# Patient Record
Sex: Female | Born: 1960 | Race: White | Hispanic: No
Health system: Southern US, Community
[De-identification: ages and names within clinical notes are randomized; demographics above are authoritative.]

## PROBLEM LIST (undated history)

## (undated) DIAGNOSIS — J449 Chronic obstructive pulmonary disease, unspecified: Secondary | ICD-10-CM

---

## 2002-03-13 ENCOUNTER — Other Ambulatory Visit: Admission: RE | Admit: 2002-03-13 | Discharge: 2002-03-13 | Payer: Self-pay | Admitting: Unknown Physician Specialty

## 2013-02-26 ENCOUNTER — Telehealth: Payer: Self-pay | Admitting: Nurse Practitioner

## 2013-02-26 NOTE — Telephone Encounter (Signed)
Samples up front. Pt notified 

## 2013-02-26 NOTE — Telephone Encounter (Signed)
Ok to give samples if we have .

## 2013-02-26 NOTE — Telephone Encounter (Signed)
Please advise 

## 2013-05-03 ENCOUNTER — Other Ambulatory Visit: Payer: Self-pay

## 2013-05-03 MED ORDER — CITALOPRAM HYDROBROMIDE 40 MG PO TABS: 40.0000 mg | ORAL_TABLET | Freq: Every day | ORAL | Status: AC

## 2013-05-03 NOTE — Telephone Encounter (Signed)
Last seen 01/2012   DFS

## 2013-08-30 ENCOUNTER — Telehealth: Payer: Self-pay | Admitting: Nurse Practitioner

## 2013-08-30 NOTE — Telephone Encounter (Signed)
Samples up front 

## 2013-08-30 NOTE — Telephone Encounter (Signed)
Not on med list please review

## 2013-08-30 NOTE — Telephone Encounter (Signed)
Ok for samples?

## 2013-11-20 ENCOUNTER — Telehealth: Payer: Self-pay | Admitting: Nurse Practitioner

## 2013-11-21 NOTE — Telephone Encounter (Signed)
Pt not seen in epic and not on med list?

## 2013-11-21 NOTE — Telephone Encounter (Signed)
No samples available. Needs to make appt to be seeen. Last ov 5/13

## 2013-11-21 NOTE — Telephone Encounter (Signed)
Ok for samples if we have 

## 2014-10-31 ENCOUNTER — Telehealth: Payer: Self-pay | Admitting: Family Medicine

## 2014-10-31 NOTE — Telephone Encounter (Signed)
Patient aware we have no documentation of a pneumonia vaccine

## 2015-10-07 ENCOUNTER — Encounter (HOSPITAL_COMMUNITY): Payer: Self-pay

## 2015-10-07 ENCOUNTER — Emergency Department (HOSPITAL_COMMUNITY): Payer: Medicaid Other

## 2015-10-07 ENCOUNTER — Inpatient Hospital Stay (HOSPITAL_COMMUNITY)
Admission: EM | Admit: 2015-10-07 | Discharge: 2015-10-25 | DRG: 922 | Disposition: E | Payer: Medicaid Other | Attending: Emergency Medicine | Admitting: Emergency Medicine

## 2015-10-07 DIAGNOSIS — J449 Chronic obstructive pulmonary disease, unspecified: Secondary | ICD-10-CM | POA: Diagnosis present

## 2015-10-07 DIAGNOSIS — E872 Acidosis, unspecified: Secondary | ICD-10-CM | POA: Insufficient documentation

## 2015-10-07 DIAGNOSIS — R402432 Glasgow coma scale score 3-8, at arrival to emergency department: Secondary | ICD-10-CM | POA: Diagnosis present

## 2015-10-07 DIAGNOSIS — Z515 Encounter for palliative care: Secondary | ICD-10-CM | POA: Diagnosis present

## 2015-10-07 DIAGNOSIS — R739 Hyperglycemia, unspecified: Secondary | ICD-10-CM | POA: Diagnosis present

## 2015-10-07 DIAGNOSIS — S179XXA Crushing injury of neck, part unspecified, initial encounter: Secondary | ICD-10-CM | POA: Diagnosis present

## 2015-10-07 DIAGNOSIS — Z7951 Long term (current) use of inhaled steroids: Secondary | ICD-10-CM | POA: Diagnosis not present

## 2015-10-07 DIAGNOSIS — Z79899 Other long term (current) drug therapy: Secondary | ICD-10-CM

## 2015-10-07 DIAGNOSIS — T71162S Asphyxiation due to hanging, intentional self-harm, sequela: Secondary | ICD-10-CM | POA: Diagnosis not present

## 2015-10-07 DIAGNOSIS — R509 Fever, unspecified: Secondary | ICD-10-CM | POA: Diagnosis present

## 2015-10-07 DIAGNOSIS — G9340 Encephalopathy, unspecified: Secondary | ICD-10-CM | POA: Diagnosis present

## 2015-10-07 DIAGNOSIS — Z4659 Encounter for fitting and adjustment of other gastrointestinal appliance and device: Secondary | ICD-10-CM

## 2015-10-07 DIAGNOSIS — N179 Acute kidney failure, unspecified: Secondary | ICD-10-CM | POA: Diagnosis present

## 2015-10-07 DIAGNOSIS — I959 Hypotension, unspecified: Secondary | ICD-10-CM | POA: Diagnosis present

## 2015-10-07 DIAGNOSIS — J9601 Acute respiratory failure with hypoxia: Secondary | ICD-10-CM | POA: Diagnosis present

## 2015-10-07 DIAGNOSIS — R680 Hypothermia, not associated with low environmental temperature: Secondary | ICD-10-CM | POA: Diagnosis present

## 2015-10-07 DIAGNOSIS — T71162A Asphyxiation due to hanging, intentional self-harm, initial encounter: Secondary | ICD-10-CM | POA: Diagnosis present

## 2015-10-07 DIAGNOSIS — J384 Edema of larynx: Secondary | ICD-10-CM | POA: Diagnosis present

## 2015-10-07 DIAGNOSIS — F322 Major depressive disorder, single episode, severe without psychotic features: Secondary | ICD-10-CM | POA: Diagnosis present

## 2015-10-07 DIAGNOSIS — N170 Acute kidney failure with tubular necrosis: Secondary | ICD-10-CM | POA: Diagnosis present

## 2015-10-07 DIAGNOSIS — G931 Anoxic brain damage, not elsewhere classified: Secondary | ICD-10-CM | POA: Diagnosis present

## 2015-10-07 DIAGNOSIS — T71161A Asphyxiation due to hanging, accidental, initial encounter: Secondary | ICD-10-CM | POA: Diagnosis present

## 2015-10-07 DIAGNOSIS — G936 Cerebral edema: Secondary | ICD-10-CM | POA: Diagnosis present

## 2015-10-07 DIAGNOSIS — R0901 Asphyxia: Secondary | ICD-10-CM | POA: Diagnosis present

## 2015-10-07 DIAGNOSIS — I469 Cardiac arrest, cause unspecified: Secondary | ICD-10-CM | POA: Diagnosis present

## 2015-10-07 DIAGNOSIS — R000 Tachycardia, unspecified: Secondary | ICD-10-CM | POA: Diagnosis present

## 2015-10-07 DIAGNOSIS — Z452 Encounter for adjustment and management of vascular access device: Secondary | ICD-10-CM

## 2015-10-07 DIAGNOSIS — T71164A Asphyxiation due to hanging, undetermined, initial encounter: Secondary | ICD-10-CM

## 2015-10-07 HISTORY — DX: Chronic obstructive pulmonary disease, unspecified: J44.9

## 2015-10-07 LAB — BLOOD GAS, ARTERIAL
Acid-Base Excess: 12.3 mmol/L — ABNORMAL HIGH (ref 0.0–2.0)
Bicarbonate: 13.9 mEq/L — ABNORMAL LOW (ref 20.0–24.0)
DRAWN BY: 317771
FIO2: 1
MECHVT: 500 mL
O2 SAT: 99.2 %
PEEP/CPAP: 5 cmH2O
PH ART: 7.114 — AB (ref 7.350–7.450)
PO2 ART: 516 mmHg — AB (ref 80.0–100.0)
Patient temperature: 34
RATE: 15 resp/min
pCO2 arterial: 50.5 mmHg — ABNORMAL HIGH (ref 35.0–45.0)

## 2015-10-07 LAB — COMPREHENSIVE METABOLIC PANEL
ALT: <5 U/L — ABNORMAL LOW (ref 14–54)
AST: 68 U/L — AB (ref 15–41)
Albumin: 3.3 g/dL — ABNORMAL LOW (ref 3.5–5.0)
Alkaline Phosphatase: 80 U/L (ref 38–126)
Anion gap: 18 — ABNORMAL HIGH (ref 5–15)
BUN: 9 mg/dL (ref 6–20)
CHLORIDE: 100 mmol/L — AB (ref 101–111)
CO2: 18 mmol/L — ABNORMAL LOW (ref 22–32)
Calcium: 8.8 mg/dL — ABNORMAL LOW (ref 8.9–10.3)
Creatinine, Ser: 1.28 mg/dL — ABNORMAL HIGH (ref 0.44–1.00)
GFR, EST AFRICAN AMERICAN: 55 mL/min — AB (ref >60)
GFR, EST NON AFRICAN AMERICAN: 47 mL/min — AB (ref >60)
Glucose, Bld: 368 mg/dL — ABNORMAL HIGH (ref 65–99)
POTASSIUM: 4 mmol/L (ref 3.5–5.1)
Sodium: 136 mmol/L (ref 135–145)
Total Bilirubin: 0.4 mg/dL (ref 0.3–1.2)
Total Protein: 6.1 g/dL — ABNORMAL LOW (ref 6.5–8.1)

## 2015-10-07 LAB — CBC WITH DIFFERENTIAL/PLATELET
BASOS PCT: 1 %
Basophils Absolute: 0 10*3/uL (ref 0.0–0.1)
EOS ABS: 0.1 10*3/uL (ref 0.0–0.7)
EOS PCT: 1 %
HCT: 42.8 % (ref 36.0–46.0)
Hemoglobin: 13.4 g/dL (ref 12.0–15.0)
LYMPHS ABS: 3.5 10*3/uL (ref 0.7–4.0)
Lymphocytes Relative: 48 %
MCH: 30.1 pg (ref 26.0–34.0)
MCHC: 31.3 g/dL (ref 30.0–36.0)
MCV: 96.2 fL (ref 78.0–100.0)
Monocytes Absolute: 0.3 10*3/uL (ref 0.1–1.0)
Monocytes Relative: 5 %
Neutro Abs: 3.2 10*3/uL (ref 1.7–7.7)
Neutrophils Relative %: 45 %
PLATELETS: 184 10*3/uL (ref 150–400)
RBC: 4.45 MIL/uL (ref 3.87–5.11)
RDW: 13.4 % (ref 11.5–15.5)
WBC: 7.2 10*3/uL (ref 4.0–10.5)

## 2015-10-07 LAB — RAPID URINE DRUG SCREEN, HOSP PERFORMED
Amphetamines: NOT DETECTED
BARBITURATES: NOT DETECTED
Benzodiazepines: NOT DETECTED
Cocaine: NOT DETECTED
Opiates: NOT DETECTED
Tetrahydrocannabinol: NOT DETECTED

## 2015-10-07 LAB — URINE MICROSCOPIC-ADD ON

## 2015-10-07 LAB — I-STAT BETA HCG BLOOD, ED (MC, WL, AP ONLY)

## 2015-10-07 LAB — URINALYSIS, ROUTINE W REFLEX MICROSCOPIC
Bilirubin Urine: NEGATIVE
Glucose, UA: 500 mg/dL — AB
Ketones, ur: NEGATIVE mg/dL
LEUKOCYTES UA: NEGATIVE
Nitrite: POSITIVE — AB
PROTEIN: 100 mg/dL — AB
Specific Gravity, Urine: 1.02 (ref 1.005–1.030)
pH: 7 (ref 5.0–8.0)

## 2015-10-07 LAB — ETHANOL

## 2015-10-07 LAB — PREGNANCY, URINE: PREG TEST UR: NEGATIVE

## 2015-10-07 LAB — TROPONIN I

## 2015-10-07 LAB — I-STAT CG4 LACTIC ACID, ED: LACTIC ACID, VENOUS: 11.89 mmol/L — AB (ref 0.5–2.0)

## 2015-10-07 MED ORDER — NOREPINEPHRINE BITARTRATE 1 MG/ML IV SOLN
1.0000 ug/min | INTRAVENOUS | Status: DC
  Filled 2015-10-07: qty 4

## 2015-10-07 MED ORDER — SODIUM CHLORIDE 0.9 % IV SOLN
Freq: Once | INTRAVENOUS | Status: AC
  Administered 2015-10-07: 20:00:00 via INTRAVENOUS

## 2015-10-07 NOTE — ED Notes (Signed)
Family in room with patient

## 2015-10-07 NOTE — H&P (Signed)
PULMONARY / CRITICAL CARE MEDICINE   Name: Cassandra Donovan MRN: [id number] DOB: April 09, 1961    ADMISSION DATE:  10/19/2015 CONSULTATION DATE:  10/16/2015  REFERRING MD:  EDP at [facility]  CHIEF COMPLAINT:  Suicide attempt via hanging followed by probable respiratory leading to cardiac arrest.  HISTORY OF PRESENT ILLNESS:  Pt is encephelopathic; therefore, this HPI is obtained from chart review. Cassandra Donovan is a 54 y.o. female with PMH of COPD.  She was taken to [facility] 12/14 after she hung herself from a tree.  She apparently called EMS and stated she was suicidal and was going to hang herself.  EMS arrived at the scene 19 minutes later and found pt hanging from a tree with nylon rope.  She was reportedly asystole on the scene.  She received 4 rounds of epi and 1 round of atropine before ROSC, roughly 7 - 10 minutes.  There were multiple attempts at securing airway in the field which were unsuccessful (likely due to laryngeal edema).  In ED, pt was completely unresponsive and had non-reactive pupils.  CT of the head and c-spine were obtained and revealed probable diffuse cerebral edema / anoxia.  There were no acute cervical spine abnormalities.  PAST MEDICAL HISTORY :  She  has a past medical history of COPD (chronic obstructive pulmonary disease) (HCC).  PAST SURGICAL HISTORY: She  has no past surgical history on file.  Allergies  Allergen Reactions  . Silver Other (See Comments)    Unknown reaction    No current facility-administered medications on file prior to encounter.   Current Outpatient Prescriptions on File Prior to Encounter  Medication Sig  . citalopram (CELEXA) 40 MG tablet Take 1 tablet (40 mg total) by mouth daily.    FAMILY HISTORY:  Her has no family status information on file.   SOCIAL HISTORY: She    REVIEW OF SYSTEMS:  Unable to obtain as pt is encephalopathic.  SUBJECTIVE: On full vent support.  VITAL SIGNS: BP 138/104 mmHg  Pulse 125  Temp(Src)  93 F (33.9 C)  Resp 16  SpO2 100%  LMP  (LMP Unknown)  HEMODYNAMICS:    VENTILATOR SETTINGS: Vent Mode:  [-] PRVC FiO2 (%):  [100 %] 100 % Set Rate:  [15 bmp] 15 bmp Vt Set:  [500 mL] 500 mL PEEP:  [5 cmH20] 5 cmH20 Plateau Pressure:  [18 cmH20] 18 cmH20  INTAKE / OUTPUT:     PHYSICAL EXAMINATION: General: Adult female, in NAD. Neuro: Sedated.  Does not follow commands.  GCS 3. HEENT: C-collar in place.  Mild ecchymosis to anterior neck. Pupils non-reactive. No corneals, no gag. Cardiovascular: Tachy, regular, no M/R/G.  Lungs: Respirations even and unlabored.  Coarse bilaterally. Abdomen: BS x 4, soft, NT/ND.  Musculoskeletal: No gross deformities, no edema.  Skin: Intact, warm, no rashes.   LABS:  BMET  Recent Labs Lab 10/02/2015 2000  NA 136  K 4.0  CL 100*  CO2 18*  BUN 9  CREATININE 1.28*  GLUCOSE 368*    Electrolytes  Recent Labs Lab 10/15/2015 2000  CALCIUM 8.8*    CBC  Recent Labs Lab 10/11/2015 2000  WBC 7.2  HGB 13.4  HCT 42.8  PLT 184    Coag's No results for input(s): APTT, INR in the last 168 hours.  Sepsis Markers  Recent Labs Lab 10/24/2015 2030  LATICACIDVEN 11.89*    ABG  Recent Labs Lab 10/06/2015 2049  PHART 7.114*  PCO2ART 50.5*  PO2ART 516*  Liver Enzymes  Recent Labs Lab 2015-08-17 2000  AST 68*  ALT <5*  ALKPHOS 80  BILITOT 0.4  ALBUMIN 3.3*    Cardiac Enzymes  Recent Labs Lab 2015-08-17 2000  TROPONINI <0.03    Glucose No results for input(s): GLUCAP in the last 168 hours.  Imaging Ct Head Wo Contrast  09/05/15  CLINICAL DATA:  Found hanging from tree with a rope around her neck, GCS = 3 EXAM: CT HEAD WITHOUT CONTRAST CT CERVICAL SPINE WITHOUT CONTRAST TECHNIQUE: Multidetector CT imaging of the head and cervical spine was performed following the standard protocol without intravenous contrast. Multiplanar CT image reconstructions of the cervical spine were also generated. COMPARISON:  None  FINDINGS: CT HEAD FINDINGS Small ventricular system. No significant midline shift. Diminished gray-white differentiation diffusely throughout the cerebral hemispheres with low-attenuation at the basal ganglia, likely representing diffuse edema/anoxia. No intracranial hemorrhage, mass lesion, extra-axial fluid collection or focal acute infarct. Sinuses and mastoid air cells clear. No acute osseous findings. CT CERVICAL SPINE FINDINGS Endotracheal nasogastric tubes present. No definite prevertebral soft tissue swelling. Osseous mineralization normal. Vertebral body and disc space heights maintained. Visualized skullbase intact. No cervical spine fracture or subluxation. Spiculated density LEFT apex 12 x 10 mm, question scar versus pulmonary nodule/tumor. IMPRESSION: Probable diffuse cerebral edema/anoxia with poor gray-white differentiation and low-attenuation of the basal ganglia. No acute intracranial hemorrhage. No acute cervical spine abnormalities. Spiculated 12 x 10 mm LEFT upper lobe nodular density question scarring though pulmonary neoplasm not excluded. Electronically Signed   By: Ulyses SouthwardMark  Boles M.D.   On: 09/05/15 21:03   Ct Cervical Spine Wo Contrast  09/05/15  CLINICAL DATA:  Found hanging from tree with a rope around her neck, GCS = 3 EXAM: CT HEAD WITHOUT CONTRAST CT CERVICAL SPINE WITHOUT CONTRAST TECHNIQUE: Multidetector CT imaging of the head and cervical spine was performed following the standard protocol without intravenous contrast. Multiplanar CT image reconstructions of the cervical spine were also generated. COMPARISON:  None FINDINGS: CT HEAD FINDINGS Small ventricular system. No significant midline shift. Diminished gray-white differentiation diffusely throughout the cerebral hemispheres with low-attenuation at the basal ganglia, likely representing diffuse edema/anoxia. No intracranial hemorrhage, mass lesion, extra-axial fluid collection or focal acute infarct. Sinuses and mastoid air  cells clear. No acute osseous findings. CT CERVICAL SPINE FINDINGS Endotracheal nasogastric tubes present. No definite prevertebral soft tissue swelling. Osseous mineralization normal. Vertebral body and disc space heights maintained. Visualized skullbase intact. No cervical spine fracture or subluxation. Spiculated density LEFT apex 12 x 10 mm, question scar versus pulmonary nodule/tumor. IMPRESSION: Probable diffuse cerebral edema/anoxia with poor gray-white differentiation and low-attenuation of the basal ganglia. No acute intracranial hemorrhage. No acute cervical spine abnormalities. Spiculated 12 x 10 mm LEFT upper lobe nodular density question scarring though pulmonary neoplasm not excluded. Electronically Signed   By: Ulyses SouthwardMark  Boles M.D.   On: 09/05/15 21:03   Dg Chest Port 1 View  09/05/15  CLINICAL DATA:  Hanging injury. EXAM: PORTABLE CHEST 1 VIEW COMPARISON:  None. FINDINGS: Pacer/ defibrillator artifact projects over the right-sided chest. Endotracheal tube terminates 4.3 cm above carina.Nasogastric tube extends beyond the inferior aspect of the film. Midline trachea. Normal heart size. No pleural effusion or pneumothorax. Pulmonary interstitial prominence could be technique related or represent the sequelae of smoking/chronic bronchitis. No lobar consolidation. IMPRESSION: No acute cardiopulmonary disease. Electronically Signed   By: Jeronimo GreavesKyle  Talbot M.D.   On: 09/05/15 20:15     STUDIES:  CT head / cspine 12/14 >  probable diffuse cerebral edema / anoxia.  No acute intracranial hemorrhage or cervical spine abnormalities.  Spiculated 12x86mm LUL nodular density, question scarring though neoplasm not excluded. CXR 12/14 > no acute process.  CULTURES: None.  ANTIBIOTICS: None.  SIGNIFICANT EVENTS: 12/14 > admitted after suicide attempt via hanging.  LINES/TUBES: ETT 12/14 >  DISCUSSION: Cassandra Donovan is a 54 y.o. F who had suicidal attempt via hanging on 12/14.  She called EMS  prior to hanging herself.  She had roughly 7 - 10 minutes of ACLS after EMS arrived (roughly 19 minutes after she called EMS).  CT of the head revealed probable diffuse cerebral edema / anoxia.  She was transferred to [facility] for further evaluation and management.  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 P:   Sedation:  Fentanyl PRN / Midazolam PRN. RASS goal: 0 to -1. Daily WUA. Neurology consulted, appreciate input. Poor prognosis for any neuro recovery Will need suicide precautions if neuro status improves. If recovers, will need psych consult once extubated.  PULMONARY A: Probable respiratory leading to cardiac arrest due to asphyxiation following suicide attempt via hanging - had difficult airway with concern for laryngeal edema / crush injury to neck. Hx COPD. P:   Full vent support. Repeat ABG and adjust vent accordingly. VAP prevention measures. No SBT unless neuro status improves. CXR in AM. Consider dedicated CT neck to eval for laryngeal, tracheal injury. May be irrelevant at this point given neuro status  CARDIOVASCULAR A:  Cardiac arrest - due to asphyxiation following suicide attempt via hanging. P:  Levophed as needed to maintain goal MAP > 65. Trend troponins / lactate. EKG in AM.  RENAL A:   AKI. AG metabolic acidosis - lactate. Pseudohypocalcemia - corrects to 9.36. P:   NS @ 100. Trend lactate. BMP in AM.  GASTROINTESTINAL A:  GI prophylaxis. Nutrition. P:   SUP: Pantoprazole. NPO.  HEMATOLOGIC A:   VTE Prophylaxis. P:  SCD's. CBC in AM.  INFECTIOUS A:   Fever, likely neuro in origin, no clear focus infxn P:   Monitor clinically. Obtain blood, urine, resp cx's  ENDOCRINE A:   Hyperglycemia.   P:   SSI. Assess Hgb A1c.   Family updated: Daughter at bedside.  Interdisciplinary Family Meeting v Palliative  Care Meeting:  Due by: 12/20.   Rutherford Guys, [place] [facility] Pager: [phone]  or [phone] 10/16/2015, 9:41 PM   Attending Note:  I have examined patient, reviewed labs, studies and notes. I have discussed the case with Ihor Dow, and I agree with the data and plans as amended above. 54 yo s/p resp arrest and ACLS following intentional asphyxiation. Ventilated post-CPR and transferred to [facility] ICU. On my eval completely unresponsive with negative reflexes except for spontaneous respirations. Head Ct consistent with evolving cerebral edema and anoxic injury. Will continue vent support pending re-eval by neuro am 12/15. Likely not recoverable. Will need to discuss withdrawal of care.  Independent critical care time is 40 minutes.   Levy Pupa, MD, PhD 10/15/15, 5:20 AM [facility] [phone] or if no answer [phone]

## 2015-10-07 NOTE — ED Notes (Signed)
CBG 275 

## 2015-10-07 NOTE — ED Notes (Signed)
Central line place on right side of neck per Dr. Hyacinth MeekerMiller

## 2015-10-07 NOTE — ED Provider Notes (Addendum)
CSN: [id number]     Arrival date & time 10/27/15  2003 History   By signing my name below, I, Arlan Organ, attest that this documentation has been prepared under the direction and in the presence of Eber Hong, MD.  Electronically Signed: Arlan Organ, ED Scribe. 2015-10-27. 8:31 PM.   Chief Complaint  Patient presents with  . Unresponsive    LEVEL 5 CAVEAT- PT IS UNRESPONSIVE  The history is provided by the EMS personnel. No language interpreter was used.    HPI Comments: Michon L Milstein is a 54 y.o. female without any pertinent past medical history who presents to the Emergency Department here after a suicide attempt this evening. Police arrived at 6:56 PM and found pt hanging from a tree with a rope around her neck. EMS arrived and started CPR at 7:04. C-spine was maintained. 4 doses of Epinephrine and 1 dose of Atropine administered on the scene. ROSC return reported. However, unable to obtain airway in field due to crush injury of the neck. Oral block placed with BVM.   PCP: No primary care provider on file.    Past Medical History  Diagnosis Date  . COPD (chronic obstructive pulmonary disease) (HCC)    No past surgical history on file. No family history on file. Social History  Substance Use Topics  . Smoking status: None  . Smokeless tobacco: None  . Alcohol Use: None   OB History    No data available     Review of Systems  Unable to perform ROS: Patient unresponsive      Allergies  Silver  Home Medications   Prior to Admission medications   Medication Sig Start Date End Date Taking? Authorizing Provider  Fluticasone-Salmeterol (ADVAIR) 250-50 MCG/DOSE AEPB Inhale 1 puff into the lungs 2 (two) times daily.   Yes Historical Provider, MD  citalopram (CELEXA) 40 MG tablet Take 1 tablet (40 mg total) by mouth daily. 05/03/13   Ileana Ladd, MD   Triage Vitals: BP 132/94 mmHg  Pulse 125  Temp(Src) 94.5 F (34.7 C)  Resp 12  SpO2 100%  LMP  (LMP  Unknown)   Physical Exam  Constitutional: She appears well-developed and well-nourished.  Pt is unresponsive   HENT:  Head: Normocephalic and atraumatic.  Eyes: Right eye exhibits no discharge. Left eye exhibits no discharge. No scleral icterus.  Pupil 5 mm and non reactive  Neck: No JVD present. No thyromegaly present.  Ligature mark around neck  Cardiovascular: Regular rhythm, normal heart sounds and intact distal pulses.  Tachycardia present.  Exam reveals no gallop and no friction rub.   No murmur heard. Pt is tachycardic to 115  Pulmonary/Chest:  Lungs clear to auscultation with assisted ventilation No spontaneous ventilation  Abdominal: Soft. Bowel sounds are normal. She exhibits no distension and no mass. There is no tenderness.  Musculoskeletal: Normal range of motion. She exhibits no edema or tenderness.  Lymphadenopathy:    She has no cervical adenopathy.  Neurological:  GCS 3 unresponsive   Skin: Skin is warm and dry. No rash noted. No erythema.  Psychiatric: She has a normal mood and affect. Her behavior is normal.  Nursing note and vitals reviewed.   ED Course  NG placement Date/Time: 10-27-2015 8:00 PM Performed by: Eber Hong Authorized by: Eber Hong Consent: The procedure was performed in an emergent situation. Verbal consent obtained. Risks and benefits: risks, benefits and alternatives were discussed Consent given by: daughter. Required items: required blood products, implants, devices, and  special equipment available Patient identity confirmed: arm band Time out: Immediately prior to procedure a "time out" was called to verify the correct patient, procedure, equipment, support staff and site/side marked as required. Preparation: Patient was prepped and draped in the usual sterile fashion. Local anesthesia used: no Patient sedated: no Patient tolerance: Patient tolerated the procedure well with no immediate complications  .Central Line Date/Time:  10/01/2015 10:00 PM Performed by: Eber Hong Authorized by: Eber Hong Consent: The procedure was performed in an emergent situation. Risks and benefits: risks, benefits and alternatives were discussed Consent given by: daughter. Site marked: the operative site was marked Imaging studies: imaging studies available Required items: required blood products, implants, devices, and special equipment available Patient identity confirmed: hospital-assigned identification number and arm band Time out: Immediately prior to procedure a "time out" was called to verify the correct patient, procedure, equipment, support staff and site/side marked as required. Indications: vascular access and central pressure monitoring Patient sedated: no Preparation: skin prepped with ChloraPrep Skin prep agent dried: skin prep agent completely dried prior to procedure Sterile barriers: all five maximum sterile barriers used - cap, mask, sterile gown, sterile gloves, and large sterile sheet Hand hygiene: hand hygiene performed prior to central venous catheter insertion Location details: right internal jugular Patient position: Trendelenburg Catheter type: triple lumen Pre-procedure: landmarks identified Ultrasound guidance: yes Sterile ultrasound techniques: sterile gel and sterile probe covers were used Number of attempts: 1 Successful placement: yes Post-procedure: line sutured and dressing applied Assessment: blood return through all ports,  free fluid flow,  placement verified by x-ray and no pneumothorax on x-ray Patient tolerance: Patient tolerated the procedure well with no immediate complications  ARTERIAL LINE Date/Time: 10/10/2015 11:22 PM Performed by: Eber Hong Authorized by: Eber Hong Consent: The procedure was performed in an emergent situation. Required items: required blood products, implants, devices, and special equipment available Patient identity confirmed: hospital-assigned  identification number Time out: Immediately prior to procedure a "time out" was called to verify the correct patient, procedure, equipment, support staff and site/side marked as required. Preparation: Patient was prepped and draped in the usual sterile fashion. Indications: multiple ABGs and hemodynamic monitoring Location: right radial Patient sedated: no Allen's test normal: yes Seldinger technique: Seldinger technique used Number of attempts: 1 Post-procedure: dressing applied Post-procedure CMS: normal Patient tolerance: Patient tolerated the procedure well with no immediate complications   (including critical care time)  DIAGNOSTIC STUDIES: Oxygen Saturation is 100% on RA, Normal by my interpretation.    COORDINATION OF CARE: 8:25 PM-Discussed treatment plan with pt at bedside and pt agreed to plan.     8:38 PM- Spoke with critical care and recommended levophed for hypotension.   INTUBATION Performed by: Vida Roller   Required items: required blood products, implants, devices, and special equipment available Patient identity confirmed: provided demographic data and hospital-assigned identification number Time out: Immediately prior to procedure a "time out" was called to verify the correct patient, procedure, equipment, support staff and site/side marked as required.  Indications: Apnea, asphyxiation  Intubation method: Direct Laryngoscopy   Preoxygenation: BVM  Sedatives: No Etomidate Paralytic: No Succinylcholine  Tube Size: 7.0  cuffed  Post-procedure assessment: chest rise and ETCO2 monitor Breath sounds: equal and absent over the epigastrium Tube secured with: ETT holder Chest x-ray interpreted by radiologist and me.  Chest x-ray findings: endotracheal tube in appropriate position  Patient tolerated the procedure well with no immediate complications.   Labs Review Labs Reviewed  COMPREHENSIVE METABOLIC PANEL - Abnormal;  Notable for the following:     Chloride 100 (*)    CO2 18 (*)    Glucose, Bld 368 (*)    Creatinine, Ser 1.28 (*)    Calcium 8.8 (*)    Total Protein 6.1 (*)    Albumin 3.3 (*)    AST 68 (*)    ALT <5 (*)    GFR calc non Af Amer 47 (*)    GFR calc Af Amer 55 (*)    Anion gap 18 (*)    All other components within normal limits  URINALYSIS, ROUTINE W REFLEX MICROSCOPIC (NOT AT [facility]) - Abnormal; Notable for the following:    Glucose, UA 500 (*)    Hgb urine dipstick LARGE (*)    Protein, ur 100 (*)    Nitrite POSITIVE (*)    All other components within normal limits  BLOOD GAS, ARTERIAL - Abnormal; Notable for the following:    pH, Arterial 7.114 (*)    pCO2 arterial 50.5 (*)    pO2, Arterial 516 (*)    Bicarbonate 13.9 (*)    Acid-Base Excess 12.3 (*)    All other components within normal limits  URINE MICROSCOPIC-ADD ON - Abnormal; Notable for the following:    Squamous Epithelial / LPF 0-5 (*)    Bacteria, UA MANY (*)    All other components within normal limits  I-STAT CG4 LACTIC ACID, ED - Abnormal; Notable for the following:    Lactic Acid, Venous 11.89 (*)    All other components within normal limits  CBC WITH DIFFERENTIAL/PLATELET  ETHANOL  PREGNANCY, URINE  URINE RAPID DRUG SCREEN, HOSP PERFORMED  TROPONIN I  I-STAT BETA HCG BLOOD, ED ([facility], [facility], [facility] ONLY)  I-STAT CHEM 8, ED  I-STAT CG4 LACTIC ACID, ED    Imaging Review Ct Head Wo Contrast  08/03/15  CLINICAL DATA:  Found hanging from tree with a rope around her neck, GCS = 3 EXAM: CT HEAD WITHOUT CONTRAST CT CERVICAL SPINE WITHOUT CONTRAST TECHNIQUE: Multidetector CT imaging of the head and cervical spine was performed following the standard protocol without intravenous contrast. Multiplanar CT image reconstructions of the cervical spine were also generated. COMPARISON:  None FINDINGS: CT HEAD FINDINGS Small ventricular system. No significant midline shift. Diminished gray-white differentiation diffusely throughout the cerebral hemispheres with  low-attenuation at the basal ganglia, likely representing diffuse edema/anoxia. No intracranial hemorrhage, mass lesion, extra-axial fluid collection or focal acute infarct. Sinuses and mastoid air cells clear. No acute osseous findings. CT CERVICAL SPINE FINDINGS Endotracheal nasogastric tubes present. No definite prevertebral soft tissue swelling. Osseous mineralization normal. Vertebral body and disc space heights maintained. Visualized skullbase intact. No cervical spine fracture or subluxation. Spiculated density LEFT apex 12 x 10 mm, question scar versus pulmonary nodule/tumor. IMPRESSION: Probable diffuse cerebral edema/anoxia with poor gray-white differentiation and low-attenuation of the basal ganglia. No acute intracranial hemorrhage. No acute cervical spine abnormalities. Spiculated 12 x 10 mm LEFT upper lobe nodular density question scarring though pulmonary neoplasm not excluded. Electronically Signed   By: Ulyses SouthwardMark  Boles M.D.   On: 08/03/15 21:03   Ct Cervical Spine Wo Contrast  08/03/15  CLINICAL DATA:  Found hanging from tree with a rope around her neck, GCS = 3 EXAM: CT HEAD WITHOUT CONTRAST CT CERVICAL SPINE WITHOUT CONTRAST TECHNIQUE: Multidetector CT imaging of the head and cervical spine was performed following the standard protocol without intravenous contrast. Multiplanar CT image reconstructions of the cervical spine were also generated. COMPARISON:  None FINDINGS:  CT HEAD FINDINGS Small ventricular system. No significant midline shift. Diminished gray-white differentiation diffusely throughout the cerebral hemispheres with low-attenuation at the basal ganglia, likely representing diffuse edema/anoxia. No intracranial hemorrhage, mass lesion, extra-axial fluid collection or focal acute infarct. Sinuses and mastoid air cells clear. No acute osseous findings. CT CERVICAL SPINE FINDINGS Endotracheal nasogastric tubes present. No definite prevertebral soft tissue swelling. Osseous  mineralization normal. Vertebral body and disc space heights maintained. Visualized skullbase intact. No cervical spine fracture or subluxation. Spiculated density LEFT apex 12 x 10 mm, question scar versus pulmonary nodule/tumor. IMPRESSION: Probable diffuse cerebral edema/anoxia with poor gray-white differentiation and low-attenuation of the basal ganglia. No acute intracranial hemorrhage. No acute cervical spine abnormalities. Spiculated 12 x 10 mm LEFT upper lobe nodular density question scarring though pulmonary neoplasm not excluded. Electronically Signed   By: Ulyses Southward M.D.   On: 10/20/2015 21:03   Dg Chest Port 1 View  10/12/2015  CLINICAL DATA:  Hanging injury. EXAM: PORTABLE CHEST 1 VIEW COMPARISON:  None. FINDINGS: Pacer/ defibrillator artifact projects over the right-sided chest. Endotracheal tube terminates 4.3 cm above carina.Nasogastric tube extends beyond the inferior aspect of the film. Midline trachea. Normal heart size. No pleural effusion or pneumothorax. Pulmonary interstitial prominence could be technique related or represent the sequelae of smoking/chronic bronchitis. No lobar consolidation. IMPRESSION: No acute cardiopulmonary disease. Electronically Signed   By: Jeronimo Greaves M.D.   On: 10/14/2015 20:15   Dg Chest Port 1v Same Day  09/27/2015  CLINICAL DATA:  RIGHT central line placement. Hanging injury earlier today. History of COPD. EXAM: PORTABLE CHEST 1 VIEW COMPARISON:  Chest radiograph October 07, 2015 at 2001 hours. FINDINGS: Interval placement of RIGHT internal jugular central venous catheter with distal tip projecting in mid superior vena cava. Endotracheal tube tip projects 4.2 cm above the carina. Nasogastric tube looped in proximal stomach, distal tip not imaged. Cardiomediastinal silhouette is normal. Similar bronchitic changes without pleural effusion or focal consolidation. No pneumothorax. Multiple EKG lines overlie the patient and may obscure subtle underlying  pathology. Pacer pad overlies the RIGHT chest. Soft tissue planes and included osseous structures are nonsuspicious. IMPRESSION: RIGHT internal jugular central venous catheter distal tip projects in mid superior vena cava. No pneumothorax. No apparent change in remaining life support lines. Similar bronchitic changes. Electronically Signed   By: Awilda Metro M.D.   On: 09/26/2015 22:27   I have personally reviewed and evaluated these images and lab results as part of my medical decision-making.  ED ECG REPORT  I personally interpreted this EKG   Date: 10/09/2015   Rate: 119  Rhythm: sinus tachycardia  QRS Axis: normal  Intervals: normal  ST/T Wave abnormalities: ST depressions diffusely  Conduction Disutrbances:none  Narrative Interpretation:   Old EKG Reviewed: none available   MDM   Final diagnoses:  Asphyxia  Anoxic brain injury (HCC)  Acidosis  Acute respiratory failure with hypoxia (HCC)  Hypothermia    Anoxic brain injury suspected Hypotensive and hypothermic Tachycardic No response to painful stimuli  Unable to get airway in field, I got X 1 attempt under DL, 7-0 tube I placed NG tube  D/w [facility], they have accepted to [facility] - BP improved without Levo - Ip laced an 18 gauge in the L hand  Family updated  Holding orders written for ICU at [facility]  Angiocath insertion Performed by: Eber Hong D  Consent: Verbal consent obtained. Risks and benefits: risks, benefits and alternatives were discussed Time out: Immediately prior to procedure a "  time out" was called to verify the correct patient, procedure, equipment, support staff and site/side marked as required.  Preparation: Patient was prepped and draped in the usual sterile fashion.  Vein Location: L hand  Not Ultrasound Guided  Gauge: 18  CRITICAL CARE Performed by: Vida Roller Total critical care time: 35 minutes Critical care time was exclusive of separately billable procedures and treating other  patients. Critical care was necessary to treat or prevent imminent or life-threatening deterioration. Critical care was time spent personally by me on the following activities: development of treatment plan with patient and/or surrogate as well as nursing, discussions with consultants, evaluation of patient's response to treatment, examination of patient, obtaining history from patient or surrogate, ordering and performing treatments and interventions, ordering and review of laboratory studies, ordering and review of radiographic studies, pulse oximetry and re-evaluation of patient's condition.   Normal blood return and flush without difficulty Patient tolerance: Patient tolerated the procedure well with no immediate complications.     I personally performed the services described in this documentation, which was scribed in my presence. The recorded information has been reviewed and is accurate.      Eber Hong, MD 10-21-2015 [id number]  Eber Hong, MD 10/21/15 [phone]

## 2015-10-07 NOTE — Code Documentation (Signed)
Pt was brought in by ems after finding pt hanging from a tree by a nylon rope.  Pt transported with multiple attempts to intubate without success due to airway swelling.  Pt arrives with nasal airway in place, bagging via bvm, no response to pain or other stimuli.  Pupils are 4mm non-reactive (slight reaction to right)   Pt continues to be unresponsive.

## 2015-10-08 ENCOUNTER — Inpatient Hospital Stay (HOSPITAL_COMMUNITY): Payer: Medicaid Other

## 2015-10-08 DIAGNOSIS — E872 Acidosis, unspecified: Secondary | ICD-10-CM | POA: Insufficient documentation

## 2015-10-08 DIAGNOSIS — T71162A Asphyxiation due to hanging, intentional self-harm, initial encounter: Principal | ICD-10-CM

## 2015-10-08 DIAGNOSIS — J9601 Acute respiratory failure with hypoxia: Secondary | ICD-10-CM | POA: Diagnosis present

## 2015-10-08 DIAGNOSIS — T71161A Asphyxiation due to hanging, accidental, initial encounter: Secondary | ICD-10-CM | POA: Diagnosis present

## 2015-10-08 DIAGNOSIS — G931 Anoxic brain damage, not elsewhere classified: Secondary | ICD-10-CM | POA: Insufficient documentation

## 2015-10-08 DIAGNOSIS — I469 Cardiac arrest, cause unspecified: Secondary | ICD-10-CM | POA: Diagnosis present

## 2015-10-08 DIAGNOSIS — R0901 Asphyxia: Secondary | ICD-10-CM

## 2015-10-08 LAB — BASIC METABOLIC PANEL
ANION GAP: 11 (ref 5–15)
BUN: 13 mg/dL (ref 6–20)
CHLORIDE: 109 mmol/L (ref 101–111)
CO2: 20 mmol/L — ABNORMAL LOW (ref 22–32)
Calcium: 8.3 mg/dL — ABNORMAL LOW (ref 8.9–10.3)
Creatinine, Ser: 1.61 mg/dL — ABNORMAL HIGH (ref 0.44–1.00)
GFR calc non Af Amer: 36 mL/min — ABNORMAL LOW (ref >60)
GFR, EST AFRICAN AMERICAN: 41 mL/min — AB (ref >60)
GLUCOSE: 105 mg/dL — AB (ref 65–99)
Potassium: 4.4 mmol/L (ref 3.5–5.1)
Sodium: 140 mmol/L (ref 135–145)

## 2015-10-08 LAB — CBG MONITORING, ED: GLUCOSE-CAPILLARY: 275 mg/dL — AB (ref 65–99)

## 2015-10-08 LAB — POCT I-STAT 3, ART BLOOD GAS (G3+)
Acid-base deficit: 6 mmol/L — ABNORMAL HIGH (ref 0.0–2.0)
Bicarbonate: 20.9 mEq/L (ref 20.0–24.0)
O2 SAT: 98 %
PCO2 ART: 51 mmHg — AB (ref 35.0–45.0)
PH ART: 7.233 — AB (ref 7.350–7.450)
PO2 ART: 138 mmHg — AB (ref 80.0–100.0)
Patient temperature: 103.3
TCO2: 22 mmol/L (ref 0–100)

## 2015-10-08 LAB — CBC
HEMATOCRIT: 44.6 % (ref 36.0–46.0)
HEMATOCRIT: 44.6 % (ref 36.0–46.0)
HEMOGLOBIN: 14.5 g/dL (ref 12.0–15.0)
Hemoglobin: 14.5 g/dL (ref 12.0–15.0)
MCH: 30.3 pg (ref 26.0–34.0)
MCH: 30.2 pg (ref 26.0–34.0)
MCHC: 32.5 g/dL (ref 30.0–36.0)
MCHC: 32.5 g/dL (ref 30.0–36.0)
MCV: 93.1 fL (ref 78.0–100.0)
MCV: 92.9 fL (ref 78.0–100.0)
Platelets: 190 10*3/uL (ref 150–400)
Platelets: 161 10*3/uL (ref 150–400)
RBC: 4.79 MIL/uL (ref 3.87–5.11)
RBC: 4.8 MIL/uL (ref 3.87–5.11)
RDW: 13.6 % (ref 11.5–15.5)
RDW: 13.6 % (ref 11.5–15.5)
WBC: 24.8 10*3/uL — AB (ref 4.0–10.5)
WBC: 22.6 10*3/uL — ABNORMAL HIGH (ref 4.0–10.5)

## 2015-10-08 LAB — LACTIC ACID, PLASMA: Lactic Acid, Venous: 1.8 mmol/L (ref 0.5–2.0)

## 2015-10-08 LAB — CREATININE, SERUM
Creatinine, Ser: 1.49 mg/dL — ABNORMAL HIGH (ref 0.44–1.00)
GFR calc non Af Amer: 39 mL/min — ABNORMAL LOW (ref >60)
GFR, EST AFRICAN AMERICAN: 46 mL/min — AB (ref >60)

## 2015-10-08 LAB — GLUCOSE, CAPILLARY
GLUCOSE-CAPILLARY: 85 mg/dL (ref 65–99)
Glucose-Capillary: 75 mg/dL (ref 65–99)

## 2015-10-08 LAB — PHOSPHORUS: Phosphorus: 3.8 mg/dL (ref 2.5–4.6)

## 2015-10-08 LAB — TROPONIN I
Troponin I: 5.38 ng/mL (ref <0.031)
Troponin I: 6.28 ng/mL (ref <0.031)

## 2015-10-08 LAB — TRIGLYCERIDES
Triglycerides: 139 mg/dL (ref <150)
Triglycerides: 91 mg/dL (ref <150)

## 2015-10-08 LAB — MAGNESIUM: Magnesium: 1.6 mg/dL — ABNORMAL LOW (ref 1.7–2.4)

## 2015-10-08 MED ORDER — ANTISEPTIC ORAL RINSE SOLUTION (CORINZ)
7.0000 mL | OROMUCOSAL | Status: DC
  Administered 2015-10-08 (×5): 7 mL via OROMUCOSAL

## 2015-10-08 MED ORDER — NOREPINEPHRINE BITARTRATE 1 MG/ML IV SOLN
2.0000 ug/min | INTRAVENOUS | Status: DC
  Filled 2015-10-08: qty 4

## 2015-10-08 MED ORDER — ACETAMINOPHEN 325 MG PO TABS
650.0000 mg | ORAL_TABLET | ORAL | Status: DC | PRN
  Administered 2015-10-08 (×2): 650 mg
  Filled 2015-10-08 (×2): qty 2

## 2015-10-08 MED ORDER — MIDAZOLAM HCL 5 MG/5ML IJ SOLN: 1.0000 mg | INTRAMUSCULAR | Status: DC | PRN

## 2015-10-08 MED ORDER — PANTOPRAZOLE SODIUM 40 MG IV SOLR
40.0000 mg | Freq: Every day | INTRAVENOUS | Status: DC
Start: 2015-10-08 — End: 2015-10-09

## 2015-10-08 MED ORDER — FENTANYL CITRATE (PF) 100 MCG/2ML IJ SOLN: 100.0000 ug | INTRAMUSCULAR | Status: DC | PRN

## 2015-10-08 MED ORDER — SODIUM CHLORIDE 0.9 % IV SOLN: 250.0000 mL | INTRAVENOUS | Status: DC | PRN

## 2015-10-08 MED ORDER — VITAL HIGH PROTEIN PO LIQD
1000.0000 mL | ORAL | Status: DC
  Filled 2015-10-08 (×2): qty 1000

## 2015-10-08 MED ORDER — SODIUM CHLORIDE 0.9 % IV SOLN
INTRAVENOUS | Status: DC
  Administered 2015-10-08: 03:00:00 via INTRAVENOUS

## 2015-10-08 MED ORDER — MAGNESIUM SULFATE 2 GM/50ML IV SOLN
2.0000 g | Freq: Once | INTRAVENOUS | Status: AC
  Administered 2015-10-08: 2 g via INTRAVENOUS
  Filled 2015-10-08: qty 50

## 2015-10-08 MED ORDER — PROPOFOL 1000 MG/100ML IV EMUL: 0.0000 ug/kg/min | INTRAVENOUS | Status: DC

## 2015-10-08 MED ORDER — CHLORHEXIDINE GLUCONATE 0.12% ORAL RINSE (MEDLINE KIT)
15.0000 mL | Freq: Two times a day (BID) | OROMUCOSAL | Status: DC
  Administered 2015-10-08: 15 mL via OROMUCOSAL

## 2015-10-08 MED ORDER — PANTOPRAZOLE SODIUM 40 MG IV SOLR: 40.0000 mg | Freq: Every day | INTRAVENOUS | Status: DC

## 2015-10-08 MED ORDER — HEPARIN SODIUM (PORCINE) 5000 UNIT/ML IJ SOLN: 5000.0000 [IU] | Freq: Three times a day (TID) | INTRAMUSCULAR | Status: DC

## 2015-10-08 MED ORDER — PROPOFOL 1000 MG/100ML IV EMUL: 5.0000 ug/kg/min | INTRAVENOUS | Status: DC

## 2015-10-08 MED ORDER — FENTANYL BOLUS VIA INFUSION
50.0000 ug | INTRAVENOUS | Status: DC | PRN
  Filled 2015-10-08: qty 50

## 2015-10-08 MED ORDER — PROPOFOL 1000 MG/100ML IV EMUL
INTRAVENOUS | Status: AC
  Filled 2015-10-08: qty 100

## 2015-10-08 MED ORDER — MIDAZOLAM HCL 2 MG/2ML IJ SOLN: 2.0000 mg | INTRAMUSCULAR | Status: DC | PRN

## 2015-10-08 MED ORDER — SODIUM CHLORIDE 0.9 % IV BOLUS (SEPSIS)
1000.0000 mL | Freq: Once | INTRAVENOUS | Status: AC
  Administered 2015-10-08: 1000 mL via INTRAVENOUS

## 2015-10-08 MED ORDER — INSULIN ASPART 100 UNIT/ML ~~LOC~~ SOLN: 0.0000 [IU] | SUBCUTANEOUS | Status: DC

## 2015-10-08 MED ORDER — FENTANYL CITRATE (PF) 100 MCG/2ML IJ SOLN: 50.0000 ug | INTRAMUSCULAR | Status: DC | PRN

## 2015-10-08 MED ORDER — FENTANYL CITRATE (PF) 100 MCG/2ML IJ SOLN
50.0000 ug | Freq: Once | INTRAMUSCULAR | Status: AC
  Administered 2015-10-08: 50 ug via INTRAVENOUS

## 2015-10-08 MED ORDER — ONDANSETRON HCL 4 MG/2ML IJ SOLN: 4.0000 mg | Freq: Three times a day (TID) | INTRAMUSCULAR | Status: DC | PRN

## 2015-10-08 MED ORDER — SODIUM CHLORIDE 0.9 % IV SOLN
25.0000 ug/h | INTRAVENOUS | Status: DC
  Administered 2015-10-08: 50 ug/h via INTRAVENOUS
  Filled 2015-10-08: qty 50

## 2015-10-08 MED ORDER — ACETAMINOPHEN 650 MG RE SUPP
650.0000 mg | Freq: Once | RECTAL | Status: AC
  Administered 2015-10-08: 650 mg via RECTAL
  Filled 2015-10-08: qty 1

## 2015-10-09 LAB — HEMOGLOBIN A1C
HEMOGLOBIN A1C: 6.1 % — AB (ref 4.8–5.6)
MEAN PLASMA GLUCOSE: 128 mg/dL

## 2015-10-09 LAB — GLUCOSE, CAPILLARY: GLUCOSE-CAPILLARY: 105 mg/dL — AB (ref 65–99)

## 2015-10-10 LAB — URINE CULTURE: Culture: >=100000

## 2015-10-11 LAB — CULTURE, RESPIRATORY W GRAM STAIN

## 2015-10-13 LAB — CULTURE, BLOOD (ROUTINE X 2)
CULTURE: NO GROWTH
CULTURE: NO GROWTH

## 2015-10-25 NOTE — Progress Notes (Signed)
eLink Physician-Brief Progress Note Patient Name: Thurston HoleChrystal L Tewell DOB: 1960/11/24 MRN: [id number]   Date of Service  08/16/2015  HPI/Events of Note  Family gathering and have decided to terminate life supports  eICU Interventions  Pt appears comfortable and breathing sltly over back up rate so fm notified she may survive terminal extubation but ok to pull et when they are all agreeable and focus just on comfort measures going forward      Intervention Category Major Interventions: End of life / care limitation discussion  Sandrea HughsMichael Ryelle Ruvalcaba 08/16/2015, 8:15 PM

## 2015-10-25 NOTE — Progress Notes (Signed)
Dr. Sherene SiresWert notified of decreasing blood pressure. Dr. Tyson AliasFeinstein had a discussion with family this morning and they decided to no longer escalate care and focus on comfort. No new orders at this time except to discontinue current order for Levo. Will continue to monitor closely and provide support.

## 2015-10-25 NOTE — Progress Notes (Signed)
RD consulted for Tubefeeding Mgmt, family has elected to no longer escalate care. Tubefeed was never administered.  Dionne AnoWilliam M. Annsleigh Dragoo, MS, RD LDN After Hours/Weekend Pager [phone]

## 2015-10-25 NOTE — Care Management Note (Signed)
Case Management Note  Patient Details  Name: Thurston HoleChrystal L Mckey MRN: [id number] Date of Birth: 1961-07-15  Subjective/Objective:   Pt admitted on 10/12/2015 s/p suicide attempt by hanging.   Pt with anoxic brain injury s/p cardiac arrest.  PTA, pt independent of ADLS.                  Action/Plan: Pt with poor prognosis.  Will follow progress/offer support.    Expected Discharge Date:                  Expected Discharge Plan:     In-House Referral:   Clinical Social Worker  Discharge planning Services   CM Referral  Post Acute Care Choice:    Choice offered to:     DME Arranged:    DME Agency:     HH Arranged:    HH Agency:     Status of Service:   In process, will continue to follow  Medicare Important Message Given:    Date Medicare IM Given:    Medicare IM give by:    Date Additional Medicare IM Given:    Additional Medicare Important Message give by:     If discussed at Long Length of Stay Meetings, dates discussed:    Additional Comments:  Quintella BatonJulie W. Boy Delamater, RN, BSN  Trauma/Neuro ICU Case Manager [phone]

## 2015-10-25 NOTE — Procedures (Signed)
Extubation Procedure Note  Patient Details:   Name: Cassandra Donovan DOB: Mar 12, 1961 MRN: [id number]    Terminal extubated per MD. RN and family at bedside.    Fredrich BirksMarshburn, Jezabel Lecker Lynne 10/14/2015, 9:16 PM

## 2015-10-25 NOTE — Progress Notes (Signed)
Patient ID: Cassandra Donovan, female   DOB: [birth year], 55 y.o.   MRN: [id number]  I have had extensive discussions with family daughtetr. We discussed patients current circumstances and organ failures. We also discussed patient's prior wishes under circumstances such as this. Family has decided to NOT perform resuscitation if arrest but to continue current medical support for now.  Mcarthur Rossettianiel J. Tyson AliasFeinstein, MD, FACP Pgr: [phone] [facility]

## 2015-10-25 NOTE — Progress Notes (Signed)
CDS referral number [id number]. Per Kathlene NovemberMike, CDS, will follow by phone.

## 2015-10-25 NOTE — Progress Notes (Signed)
PULMONARY / CRITICAL CARE MEDICINE   Name: Cassandra Donovan MRN: [id number] DOB: [birth year]    ADMISSION DATE:  10/09/2015 CONSULTATION DATE:  10/04/2015  REFERRING MD:  EDP at [facility]:  Suicide attempt via hanging followed by probable respiratory leading to cardiac arrest.  HISTORY OF PRESENT ILLNESS:  Pt is encephelopathic; therefore, this HPI is obtained from chart review. Cassandra Donovan is a 55 y.o. female with PMH of COPD.  She was taken to [facility] 12/14 after she hung herself from a tree.  She apparently called EMS and stated she was suicidal and was going to hang herself.  EMS arrived at the scene 19 minutes later and found pt hanging from a tree with nylon rope.  She was reportedly asystole on the scene.  She received 4 rounds of epi and 1 round of atropine before ROSC, roughly 7 - 10 minutes.  There were multiple attempts at securing airway in the field which were unsuccessful (likely due to laryngeal edema).  In ED, pt was completely unresponsive and had non-reactive pupils.  CT of the head and c-spine were obtained and revealed probable diffuse cerebral edema / anoxia.  There were no acute cervical spine abnormalities.  SUBJECTIVE: remains vented  VITAL SIGNS: BP 101/64 mmHg  Pulse 94  Temp(Src) 104.2 F (40.1 C)  Resp 24  Ht 5' 2"  (1.575 m)  Wt 66.7 kg (147 lb 0.8 oz)  BMI 26.89 kg/m2  SpO2 97%  LMP  (LMP Unknown)  HEMODYNAMICS:    VENTILATOR SETTINGS: Vent Mode:  [-] PRVC FiO2 (%):  [40 %-100 %] 50 % Set Rate:  [14 bmp-15 bmp] 14 bmp Vt Set:  [500 mL] 500 mL PEEP:  [5 cmH20] 5 cmH20 Plateau Pressure:  [18 cmH20-22 cmH20] 20 cmH20  INTAKE / OUTPUT: I/O last 3 completed shifts: In: 416.6 [I.V.:416.6] Out: 265 [Urine:265]   PHYSICAL EXAMINATION: General: Adult female, in NAD. Neuro: breaths over vent, no corneals, no per, no gag, no cough, Does not follow commands. HEENT: C-collar in place.  Mild ecchymosis to anterior neck. Pupils  non-reactive. No corneals, no gag. Cardiovascular: Tachy, regular, no M/R/G.  Lungs: Respirations even and unlabored.  Coarse bilaterally. Abdomen: BS x 4, soft, NT/ND.  Musculoskeletal: No gross deformities, no edema.  Skin: Intact, warm, no rashes.   LABS:  BMET  Recent Labs Lab 09/26/2015 2000 06-Nov-2015 0407 11/06/15 0750  NA 136  --  140  K 4.0  --  4.4  CL 100*  --  109  CO2 18*  --  20*  BUN 9  --  13  CREATININE 1.28* 1.49* 1.61*  GLUCOSE 368*  --  105*    Electrolytes  Recent Labs Lab 10/09/2015 2000 Nov 06, 2015 0750  CALCIUM 8.8* 8.3*  MG  --  1.6*  PHOS  --  3.8    CBC  Recent Labs Lab 10/15/2015 2000 2015/11/06 0407 11/06/2015 0750  WBC 7.2 24.8* 22.6*  HGB 13.4 14.5 14.5  HCT 42.8 44.6 44.6  PLT 184 190 161    Coag's No results for input(s): APTT, INR in the last 168 hours.  Sepsis Markers  Recent Labs Lab 09/30/2015 2030 2015-11-06 0407  LATICACIDVEN 11.89* 1.8    ABG  Recent Labs Lab 10/05/2015 2049 2015-11-06 0542  PHART 7.114* 7.233*  PCO2ART 50.5* 51.0*  PO2ART 516* 138.0*    Liver Enzymes  Recent Labs Lab 10/09/2015 2000  AST 68*  ALT <5*  ALKPHOS 80  BILITOT 0.4  ALBUMIN 3.3*  Cardiac Enzymes  Recent Labs Lab 10/18/2015 2000 October 21, 2015 0407  TROPONINI <0.03 5.38*    Glucose  Recent Labs Lab 10/06/2015 2003 10-21-2015 0757  GLUCAP 275* 85    Imaging Ct Head Wo Contrast  10/22/2015  CLINICAL DATA:  Found hanging from tree with a rope around her neck, GCS = 3 EXAM: CT HEAD WITHOUT CONTRAST CT CERVICAL SPINE WITHOUT CONTRAST TECHNIQUE: Multidetector CT imaging of the head and cervical spine was performed following the standard protocol without intravenous contrast. Multiplanar CT image reconstructions of the cervical spine were also generated. COMPARISON:  None FINDINGS: CT HEAD FINDINGS Small ventricular system. No significant midline shift. Diminished gray-white differentiation diffusely throughout the cerebral hemispheres  with low-attenuation at the basal ganglia, likely representing diffuse edema/anoxia. No intracranial hemorrhage, mass lesion, extra-axial fluid collection or focal acute infarct. Sinuses and mastoid air cells clear. No acute osseous findings. CT CERVICAL SPINE FINDINGS Endotracheal nasogastric tubes present. No definite prevertebral soft tissue swelling. Osseous mineralization normal. Vertebral body and disc space heights maintained. Visualized skullbase intact. No cervical spine fracture or subluxation. Spiculated density LEFT apex 12 x 10 mm, question scar versus pulmonary nodule/tumor. IMPRESSION: Probable diffuse cerebral edema/anoxia with poor gray-white differentiation and low-attenuation of the basal ganglia. No acute intracranial hemorrhage. No acute cervical spine abnormalities. Spiculated 12 x 10 mm LEFT upper lobe nodular density question scarring though pulmonary neoplasm not excluded. Electronically Signed   By: Lavonia Dana M.D.   On: 10/13/2015 21:03   Ct Cervical Spine Wo Contrast  10/05/2015  CLINICAL DATA:  Found hanging from tree with a rope around her neck, GCS = 3 EXAM: CT HEAD WITHOUT CONTRAST CT CERVICAL SPINE WITHOUT CONTRAST TECHNIQUE: Multidetector CT imaging of the head and cervical spine was performed following the standard protocol without intravenous contrast. Multiplanar CT image reconstructions of the cervical spine were also generated. COMPARISON:  None FINDINGS: CT HEAD FINDINGS Small ventricular system. No significant midline shift. Diminished gray-white differentiation diffusely throughout the cerebral hemispheres with low-attenuation at the basal ganglia, likely representing diffuse edema/anoxia. No intracranial hemorrhage, mass lesion, extra-axial fluid collection or focal acute infarct. Sinuses and mastoid air cells clear. No acute osseous findings. CT CERVICAL SPINE FINDINGS Endotracheal nasogastric tubes present. No definite prevertebral soft tissue swelling. Osseous  mineralization normal. Vertebral body and disc space heights maintained. Visualized skullbase intact. No cervical spine fracture or subluxation. Spiculated density LEFT apex 12 x 10 mm, question scar versus pulmonary nodule/tumor. IMPRESSION: Probable diffuse cerebral edema/anoxia with poor gray-white differentiation and low-attenuation of the basal ganglia. No acute intracranial hemorrhage. No acute cervical spine abnormalities. Spiculated 12 x 10 mm LEFT upper lobe nodular density question scarring though pulmonary neoplasm not excluded. Electronically Signed   By: Lavonia Dana M.D.   On: 09/27/2015 21:03   Dg Chest Port 1 View  10/01/2015  CLINICAL DATA:  Hanging injury. EXAM: PORTABLE CHEST 1 VIEW COMPARISON:  None. FINDINGS: Pacer/ defibrillator artifact projects over the right-sided chest. Endotracheal tube terminates 4.3 cm above carina.Nasogastric tube extends beyond the inferior aspect of the film. Midline trachea. Normal heart size. No pleural effusion or pneumothorax. Pulmonary interstitial prominence could be technique related or represent the sequelae of smoking/chronic bronchitis. No lobar consolidation. IMPRESSION: No acute cardiopulmonary disease. Electronically Signed   By: Abigail Miyamoto M.D.   On: 10/02/2015 20:15   Dg Chest Port 1v Same Day  10/02/2015  CLINICAL DATA:  RIGHT central line placement. Hanging injury earlier today. History of COPD. EXAM: PORTABLE CHEST 1 VIEW COMPARISON:  Chest radiograph October 07, 2015 at 2001 hours. FINDINGS: Interval placement of RIGHT internal jugular central venous catheter with distal tip projecting in mid superior vena cava. Endotracheal tube tip projects 4.2 cm above the carina. Nasogastric tube looped in proximal stomach, distal tip not imaged. Cardiomediastinal silhouette is normal. Similar bronchitic changes without pleural effusion or focal consolidation. No pneumothorax. Multiple EKG lines overlie the patient and may obscure subtle underlying  pathology. Pacer pad overlies the RIGHT chest. Soft tissue planes and included osseous structures are nonsuspicious. IMPRESSION: RIGHT internal jugular central venous catheter distal tip projects in mid superior vena cava. No pneumothorax. No apparent change in remaining life support lines. Similar bronchitic changes. Electronically Signed   By: Elon Alas M.D.   On: 10/07/2015 22:27   Dg Abd Portable 1v  2015/11/06  CLINICAL DATA:  Orogastric tube placement. EXAM: PORTABLE ABDOMEN - 1 VIEW COMPARISON:  Chest x-ray 10/05/2015. FINDINGS: Orogastric tube noted with its tip projected over distal stomach. Soft tissue structures are unremarkable. No bowel or gastric distention. No free air. No acute bony abnormality. IMPRESSION: Orogastric tube noted with its tip projected over the distal stomach. No bowel or gastric distention. Electronically Signed   By: Marcello Moores  Register   On: November 06, 2015 07:19     STUDIES:  CT head / cspine 12/14 > probable diffuse cerebral edema / anoxia.  No acute intracranial hemorrhage or cervical spine abnormalities.  Spiculated 12x75m LUL nodular density, question scarring though neoplasm not excluded. CXR 12/14 > no acute process.  CULTURES: None.  ANTIBIOTICS: None.  SIGNIFICANT EVENTS: 12/14 > admitted after suicide attempt via hanging.  LINES/TUBES: ETT 12/14 >  DISCUSSION: Cassandra Donovan a 55y.o. F who had suicidal attempt via hanging on 12/14.  She called EMS prior to hanging herself.  She had roughly 7 - 10 minutes of ACLS after EMS arrived (roughly 19 minutes after she called EMS).  CT of the head revealed probable diffuse cerebral edema / anoxia.  [facility]  ASSESSMENT / PLAN:  NEUROLOGIC A:   Acute encephalopathy - svere anoxic brain injury, huigh risk brain death P:   Sedation:  Avoiding as able RASS goal: 0  Daily WUA. EEG Will discuss consideration comfort  care  PULMONARY A: Probable respiratory leading to cardiac arrest due to asphyxiation following suicide attempt via hanging - had difficult airway with concern for laryngeal edema / crush injury to neck. Hx COPD. P:   Full vent support. Avoid acidosis Increase MV, abg reviewed Alk will not change outcome  CARDIOVASCULAR A:  Cardiac arrest - due to asphyxiation following suicide attempt via hanging. P:  MAp goal 65, no role higher tele Trop from cpr  RENAL A:   AKI. AG metabolic acidosis - lactate. Pseudohypocalcemia - corrects to 9.36. ATN hypomag P:   NS @ 100, avoid with brain edema Chem in am  Mag supp  GASTROINTESTINAL A:  GI prophylaxis. Nutrition. P:   SUP: Pantoprazole. Start TF  HEMATOLOGIC A:   VTE Prophylaxis. P:  SCD's. CBC in AM. Get coags, start sib q hep  INFECTIOUS A:   Fever, likely neuro in origin, no clear focus infxn P:   Avoid temps Add cooling blanket  ENDOCRINE A:   Hyperglycemia.   P:   SSI.   Family updated: Cassandra Donovan at bedside.  Interdisciplinary Family Meeting v Palliative Care Meeting:  Due by: 12/20.   Ccm time 30 min   DLavon Paganini FTitus Mould  MD, FACP Pgr: [facility]

## 2015-10-25 NOTE — Discharge Summary (Signed)
NAMEDERRICKA, MERTZ NO.:  [id number]  MEDICAL RECORD NO.:  [id number]  LOCATION:  3M06C                        FACILITY:  [place]  PHYSICIAN:  Raylene Miyamoto, MD DATE OF BIRTH:  [birth year]  DATE OF ADMISSION:  10/02/2015 DATE OF DISCHARGE:  18-Oct-2015                              DISCHARGE SUMMARY   DEATH SUMMARY  This is a sad case of a 55 year old female with past medical history of COPD, who was taken to the [facility] on October 07, 2015, after she was found hung herself from the tree.  She apparently called EMS and stated that she was suicidal and was going to hang herself.  EMS arrived on the scene approximately 20 minutes later and found the patient is hanging from the tree with nylon rope.  She is reportedly asystole on the scene.  She received another 7-10 minutes of ACLS and was resuscitated back to sinus rhythm.  CT scan of the head revealed severe brain edema.  The patient had a horrible neurologic examination neurologically.  The next morning, I met with the daughter and she described some mild depressive symptoms but nothing overt.  She understood that the patient was almost near brain death, but I think was not at the time.  She has only had left neurologically with ability to breathe over the ventilator.  Given the CT scan findings, poor neurologic findings on examination, comfort care was decided upon and the patient expired.  FINAL DIAGNOSES UPON DEATH: 1. Severe anoxic brain injury secondary to hanging. 2. Asphyxiation with severe hypoxemia secondary to hanging. 3. Status post cardiac arrest secondary to #2. 4. Severe major depression. 5. Chronic obstructive pulmonary disease. 6. Acute respiratory failure.     Raylene Miyamoto, MD     DJF/MEDQ  D:  10/14/2015  T:  10/15/2015  Job:  [id number]

## 2015-10-25 NOTE — Consult Note (Signed)
Admission H&P    Chief Complaint: Anoxic brain injury.  HPI: Cassandra Donovan is an 55 y.o. female with a history of COPD, brought to the emergency room at [facility] following a suicide attempt by hanging. Patient was found hanging from a tree 19 minutes after she called EMS and indicated that she was suicidal. She was found on cardiac arrest and underwent resuscitation for 7-10 minutes. She was subsequently intubated and has remained unresponsive. CT scan of her head was obtained which showed and is consistent with diffuse anoxic changes as well as early signs of cerebral edema. No intracranial hemorrhage was demonstrated.  Past Medical History  Diagnosis Date  . COPD (chronic obstructive pulmonary disease) (HCC)     No past surgical history on file.  No family history on file. Social History:  has no tobacco, alcohol, and drug history on file.  Allergies:  Allergies  Allergen Reactions  . Silver Other (See Comments)    Unknown reaction    Medications Prior to Admission  Medication Sig Dispense Refill  . Fluticasone-Salmeterol (ADVAIR) 250-50 MCG/DOSE AEPB Inhale 1 puff into the lungs 2 (two) times daily.    . citalopram (CELEXA) 40 MG tablet Take 1 tablet (40 mg total) by mouth daily. 30 tablet 0    ROS: Unavailable.  Physical Examination: Blood pressure 94/83, pulse 100, temperature 103.3 F (39.6 C), resp. rate 22, height 5' 2"  (1.575 m), weight 66.7 kg (147 lb 0.8 oz), SpO2 100 %.  HEENT-  Normocephalic, no lesions, without obvious abnormality.  Normal external eye and conjunctiva.  Normal TM's bilaterally.  Normal auditory canals and external ears. Normal external nose, mucus membranes and septum.  Normal pharynx. Neck supple with no masses, nodes, nodules or enlargement. Cardiovascular - tachycardic with regular rhythm and normal S1 and S2  Lungs - tachypneic with spontaneous respirations, coarse rhonchi noted laterally  Abdomen - soft, non-tender; bowel sounds normal; no  masses,  no organomegaly Extremities - no joint deformities, effusion, or inflammation and no edema  Neurologic Examination: Patient was intubated and on mechanical ventilation and had spontaneous respirations, as well. She was on fentanyl drip IV for sedation. Pupils were equal and did not react to light on either side. Extraocular movements are absent with oculocephalic maneuvers. Corneal reflexes were absent. Face was symmetrical with no focal weakness. Gag reflex was absent. Motor exam showed flaccid muscle tone throughout with no abnormal posturing and no spontaneous movements of extremities. Deep tendon reflexes were trace only and symmetrical. Plantar responses were mute.  Results for orders placed or performed during the hospital encounter of 10/13/2015 (from the past 48 hour(s))  CBC with Differential/Platelet     Status: None   Collection Time: 10/11/2015  8:00 PM  Result Value Ref Range   WBC 7.2 4.0 - 10.5 K/uL    Comment: REPEATED TO VERIFY   RBC 4.45 3.87 - 5.11 MIL/uL   Hemoglobin 13.4 12.0 - 15.0 g/dL   HCT 42.8 36.0 - 46.0 %   MCV 96.2 78.0 - 100.0 fL   MCH 30.1 26.0 - 34.0 pg   MCHC 31.3 30.0 - 36.0 g/dL   RDW 13.4 11.5 - 15.5 %   Platelets 184 150 - 400 K/uL   Neutrophils Relative % 45 %   Neutro Abs 3.2 1.7 - 7.7 K/uL   Lymphocytes Relative 48 %   Lymphs Abs 3.5 0.7 - 4.0 K/uL   Monocytes Relative 5 %   Monocytes Absolute 0.3 0.1 - 1.0 K/uL  Eosinophils Relative 1 %   Eosinophils Absolute 0.1 0.0 - 0.7 K/uL   Basophils Relative 1 %   Basophils Absolute 0.0 0.0 - 0.1 K/uL   WBC Morphology ATYPICAL LYMPHOCYTES     Comment: RARE NRBCs  Comprehensive metabolic panel     Status: Abnormal   Collection Time: 09/27/2015  8:00 PM  Result Value Ref Range   Sodium 136 135 - 145 mmol/L   Potassium 4.0 3.5 - 5.1 mmol/L   Chloride 100 (L) 101 - 111 mmol/L   CO2 18 (L) 22 - 32 mmol/L   Glucose, Bld 368 (H) 65 - 99 mg/dL   BUN 9 6 - 20 mg/dL   Creatinine, Ser 1.28 (H)  0.44 - 1.00 mg/dL   Calcium 8.8 (L) 8.9 - 10.3 mg/dL   Total Protein 6.1 (L) 6.5 - 8.1 g/dL   Albumin 3.3 (L) 3.5 - 5.0 g/dL   AST 68 (H) 15 - 41 U/L   ALT <5 (L) 14 - 54 U/L   Alkaline Phosphatase 80 38 - 126 U/L   Total Bilirubin 0.4 0.3 - 1.2 mg/dL   GFR calc non Af Amer 47 (L) >60 mL/min   GFR calc Af Amer 55 (L) >60 mL/min    Comment: (NOTE) The eGFR has been calculated using the CKD EPI equation. This calculation has not been validated in all clinical situations. eGFR's persistently <60 mL/min signify possible Chronic Kidney Disease.    Anion gap 18 (H) 5 - 15  Ethanol     Status: None   Collection Time: 10/22/2015  8:00 PM  Result Value Ref Range   Alcohol, Ethyl (B) <5 <5 mg/dL    Comment:        LOWEST DETECTABLE LIMIT FOR SERUM ALCOHOL IS 5 mg/dL FOR MEDICAL PURPOSES ONLY   Troponin I     Status: None   Collection Time: 09/29/2015  8:00 PM  Result Value Ref Range   Troponin I <0.03 <0.031 ng/mL    Comment:        NO INDICATION OF MYOCARDIAL INJURY.   Triglycerides     Status: None   Collection Time: 10/19/2015  8:00 PM  Result Value Ref Range   Triglycerides 139 <150 mg/dL  I-Stat Beta hCG blood, ED ([facility], [facility], [facility] only)     Status: None   Collection Time: 10/15/2015  8:26 PM  Result Value Ref Range   I-stat hCG, quantitative <5.0 <5 mIU/mL   Comment 3            Comment:   GEST. AGE      CONC.  (mIU/mL)   <=1 WEEK        5 - 50     2 WEEKS       50 - 500     3 WEEKS       100 - 10,000     4 WEEKS     1,000 - 30,000        FEMALE AND NON-PREGNANT FEMALE:     LESS THAN 5 mIU/mL   I-Stat CG4 Lactic Acid, ED     Status: Abnormal   Collection Time: 10/09/2015  8:30 PM  Result Value Ref Range   Lactic Acid, Venous 11.89 (HH) 0.5 - 2.0 mmol/L  Blood gas, arterial     Status: Abnormal   Collection Time: 10/03/2015  8:49 PM  Result Value Ref Range   FIO2 1.00    Delivery systems NASAL CANNULA    Mode PRESSURE  REGULATED VOLUME CONTROL    VT 500 mL   LHR 15.0 resp/min    Peep/cpap 5.0 cm H20   pH, Arterial 7.114 (LL) 7.350 - 7.450    Comment: CRITICAL RESULT CALLED TO, READ BACK BY AND VERIFIED WITH: JANETTE JOHNSON,RN BY BFRETWELL RRT,RCP ON 10/02/2015 AT 2103    pCO2 arterial 50.5 (H) 35.0 - 45.0 mmHg   pO2, Arterial 516 (H) 80.0 - 100.0 mmHg   Bicarbonate 13.9 (L) 20.0 - 24.0 mEq/L   Acid-Base Excess 12.3 (H) 0.0 - 2.0 mmol/L   O2 Saturation 99.2 %   Patient temperature 34.0    Collection site RIGHT RADIAL    Drawn by [id number]    Sample type ARTERIAL DRAW    Allens test (pass/fail) PASS PASS  Urinalysis, Routine w reflex microscopic (not at [facility])     Status: Abnormal   Collection Time: 10/22/2015  9:26 PM  Result Value Ref Range   Color, Urine YELLOW YELLOW   APPearance CLEAR CLEAR   Specific Gravity, Urine 1.020 1.005 - 1.030   pH 7.0 5.0 - 8.0   Glucose, UA 500 (A) NEGATIVE mg/dL   Hgb urine dipstick LARGE (A) NEGATIVE   Bilirubin Urine NEGATIVE NEGATIVE   Ketones, ur NEGATIVE NEGATIVE mg/dL   Protein, ur 100 (A) NEGATIVE mg/dL   Nitrite POSITIVE (A) NEGATIVE   Leukocytes, UA NEGATIVE NEGATIVE  Pregnancy, urine     Status: None   Collection Time: 10/04/2015  9:26 PM  Result Value Ref Range   Preg Test, Ur NEGATIVE NEGATIVE    Comment:        THE SENSITIVITY OF THIS METHODOLOGY IS >20 mIU/mL.   Urine rapid drug screen (hosp performed)     Status: None   Collection Time: 10/24/2015  9:26 PM  Result Value Ref Range   Opiates NONE DETECTED NONE DETECTED   Cocaine NONE DETECTED NONE DETECTED   Benzodiazepines NONE DETECTED NONE DETECTED   Amphetamines NONE DETECTED NONE DETECTED   Tetrahydrocannabinol NONE DETECTED NONE DETECTED   Barbiturates NONE DETECTED NONE DETECTED    Comment:        DRUG SCREEN FOR MEDICAL PURPOSES ONLY.  IF CONFIRMATION IS NEEDED FOR ANY PURPOSE, NOTIFY LAB WITHIN 5 DAYS.        LOWEST DETECTABLE LIMITS FOR URINE DRUG SCREEN Drug Class       Cutoff (ng/mL) Amphetamine      1000 Barbiturate       200 Benzodiazepine   244 Tricyclics       010 Opiates          300 Cocaine          300 THC              50   Urine microscopic-add on     Status: Abnormal   Collection Time: 10/01/2015  9:26 PM  Result Value Ref Range   Squamous Epithelial / LPF 0-5 (A) NONE SEEN   WBC, UA 6-30 0 - 5 WBC/hpf   RBC / HPF TOO NUMEROUS TO COUNT 0 - 5 RBC/hpf   Bacteria, UA MANY (A) NONE SEEN   Ct Head Wo Contrast  10/01/2015  CLINICAL DATA:  Found hanging from tree with a rope around her neck, GCS = 3 EXAM: CT HEAD WITHOUT CONTRAST CT CERVICAL SPINE WITHOUT CONTRAST TECHNIQUE: Multidetector CT imaging of the head and cervical spine was performed following the standard protocol without intravenous contrast. Multiplanar CT image reconstructions of the cervical spine were also generated.  COMPARISON:  None FINDINGS: CT HEAD FINDINGS Small ventricular system. No significant midline shift. Diminished gray-white differentiation diffusely throughout the cerebral hemispheres with low-attenuation at the basal ganglia, likely representing diffuse edema/anoxia. No intracranial hemorrhage, mass lesion, extra-axial fluid collection or focal acute infarct. Sinuses and mastoid air cells clear. No acute osseous findings. CT CERVICAL SPINE FINDINGS Endotracheal nasogastric tubes present. No definite prevertebral soft tissue swelling. Osseous mineralization normal. Vertebral body and disc space heights maintained. Visualized skullbase intact. No cervical spine fracture or subluxation. Spiculated density LEFT apex 12 x 10 mm, question scar versus pulmonary nodule/tumor. IMPRESSION: Probable diffuse cerebral edema/anoxia with poor gray-white differentiation and low-attenuation of the basal ganglia. No acute intracranial hemorrhage. No acute cervical spine abnormalities. Spiculated 12 x 10 mm LEFT upper lobe nodular density question scarring though pulmonary neoplasm not excluded. Electronically Signed   By: Lavonia Dana M.D.   On: 10/06/2015  21:03   Ct Cervical Spine Wo Contrast  09/29/2015  CLINICAL DATA:  Found hanging from tree with a rope around her neck, GCS = 3 EXAM: CT HEAD WITHOUT CONTRAST CT CERVICAL SPINE WITHOUT CONTRAST TECHNIQUE: Multidetector CT imaging of the head and cervical spine was performed following the standard protocol without intravenous contrast. Multiplanar CT image reconstructions of the cervical spine were also generated. COMPARISON:  None FINDINGS: CT HEAD FINDINGS Small ventricular system. No significant midline shift. Diminished gray-white differentiation diffusely throughout the cerebral hemispheres with low-attenuation at the basal ganglia, likely representing diffuse edema/anoxia. No intracranial hemorrhage, mass lesion, extra-axial fluid collection or focal acute infarct. Sinuses and mastoid air cells clear. No acute osseous findings. CT CERVICAL SPINE FINDINGS Endotracheal nasogastric tubes present. No definite prevertebral soft tissue swelling. Osseous mineralization normal. Vertebral body and disc space heights maintained. Visualized skullbase intact. No cervical spine fracture or subluxation. Spiculated density LEFT apex 12 x 10 mm, question scar versus pulmonary nodule/tumor. IMPRESSION: Probable diffuse cerebral edema/anoxia with poor gray-white differentiation and low-attenuation of the basal ganglia. No acute intracranial hemorrhage. No acute cervical spine abnormalities. Spiculated 12 x 10 mm LEFT upper lobe nodular density question scarring though pulmonary neoplasm not excluded. Electronically Signed   By: Lavonia Dana M.D.   On: 10/11/2015 21:03   Dg Chest Port 1 View  09/25/2015  CLINICAL DATA:  Hanging injury. EXAM: PORTABLE CHEST 1 VIEW COMPARISON:  None. FINDINGS: Pacer/ defibrillator artifact projects over the right-sided chest. Endotracheal tube terminates 4.3 cm above carina.Nasogastric tube extends beyond the inferior aspect of the film. Midline trachea. Normal heart size. No pleural  effusion or pneumothorax. Pulmonary interstitial prominence could be technique related or represent the sequelae of smoking/chronic bronchitis. No lobar consolidation. IMPRESSION: No acute cardiopulmonary disease. Electronically Signed   By: Abigail Miyamoto M.D.   On: 09/30/2015 20:15   Dg Chest Port 1v Same Day  09/24/2015  CLINICAL DATA:  RIGHT central line placement. Hanging injury earlier today. History of COPD. EXAM: PORTABLE CHEST 1 VIEW COMPARISON:  Chest radiograph October 07, 2015 at 2001 hours. FINDINGS: Interval placement of RIGHT internal jugular central venous catheter with distal tip projecting in mid superior vena cava. Endotracheal tube tip projects 4.2 cm above the carina. Nasogastric tube looped in proximal stomach, distal tip not imaged. Cardiomediastinal silhouette is normal. Similar bronchitic changes without pleural effusion or focal consolidation. No pneumothorax. Multiple EKG lines overlie the patient and may obscure subtle underlying pathology. Pacer pad overlies the RIGHT chest. Soft tissue planes and included osseous structures are nonsuspicious. IMPRESSION: RIGHT internal jugular central venous catheter distal  tip projects in mid superior vena cava. No pneumothorax. No apparent change in remaining life support lines. Similar bronchitic changes. Electronically Signed   By: Elon Alas M.D.   On: 10/01/2015 22:27    Assessment/Plan 55 year old lady with acute diffuse severe anoxic brain injury secondary to asphyxia and cardiac arrest. Prognosis is poor for functional recovery neurologically, if patient survives this acute injury.  Recommendations: 1. EEG, routine adult study assess severity of encephalopathy electrically, as well as rule out subclinical seizure activity. 2. No indications for continuous EEG monitoring at this point   We will continue to follow this patient with you.   C.R. Nicole Kindred, MD [facility] [phone]  26-Oct-2015, 4:30 AM

## 2015-10-25 NOTE — Progress Notes (Signed)
Time of death 2128. Two nurses listened. Emotional support given to family. Dr. Sherene SiresWert notified.

## 2015-10-25 DEATH — deceased

## 2017-05-31 IMAGING — CT CT CERVICAL SPINE W/O CM
3 of 5 series · 10 of 33 positions shown, 12 images · non-contrast
Comparison: None

CLINICAL DATA: Found hanging from tree with a rope around her neck,
GCS = 3

EXAM:
CT HEAD WITHOUT CONTRAST
CT CERVICAL SPINE WITHOUT CONTRAST
TECHNIQUE: Multidetector CT imaging of the head and cervical spine was
performed following the standard protocol without intravenous
contrast. Multiplanar CT image reconstructions of the cervical spine
were also generated.

[Series 5: cervical st 2.0 b31s · axial · 0.27mm/px · z∈[+79,+143]mm · 2 of 82 slices shown, 3 images]
[im 33/82  soft-tissue]
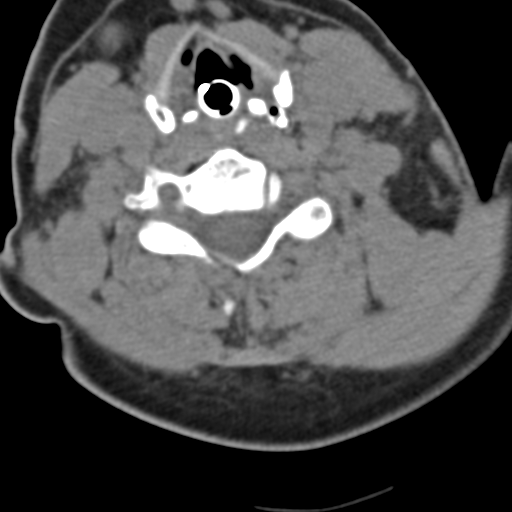
[im 33/82  bone]
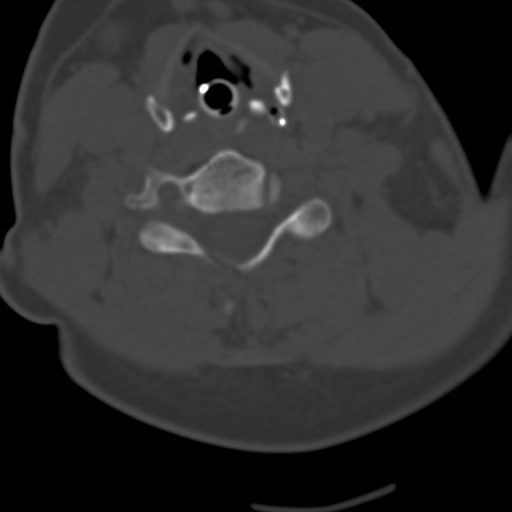
[im 65/82  bone]
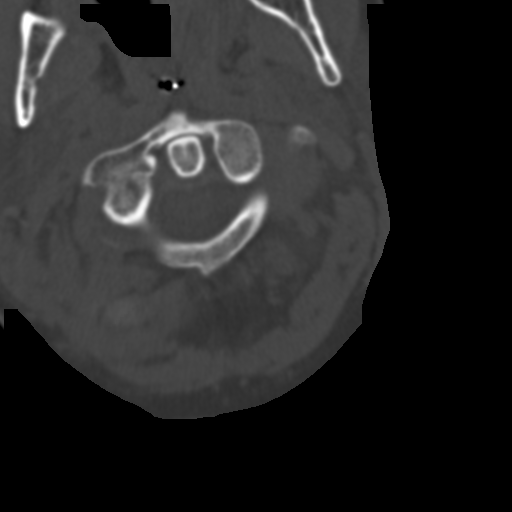

[Series 7: sagittal bone 2.0 · sagittal · 0.19mm/px · 5 of 47 slices shown, 6 images]
[im 16/47  bone]
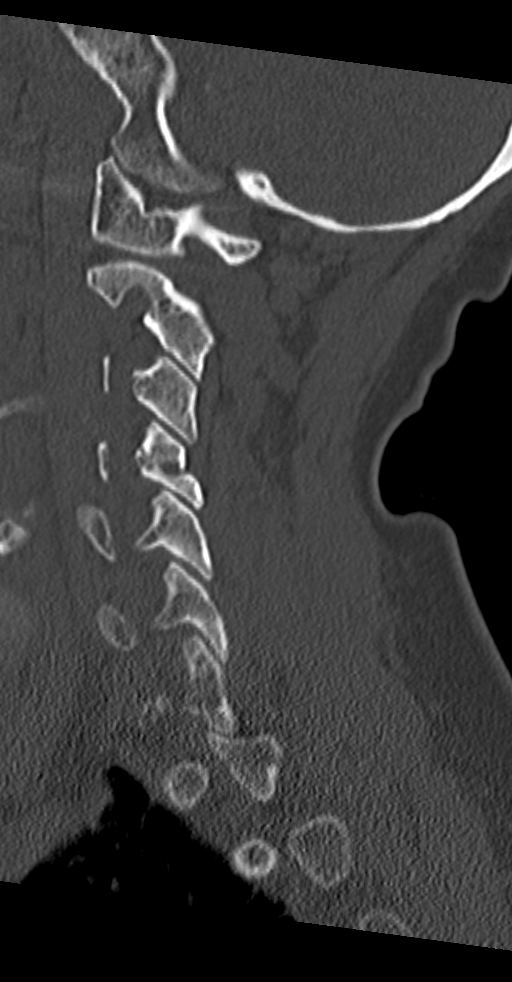
[im 20/47  bone]
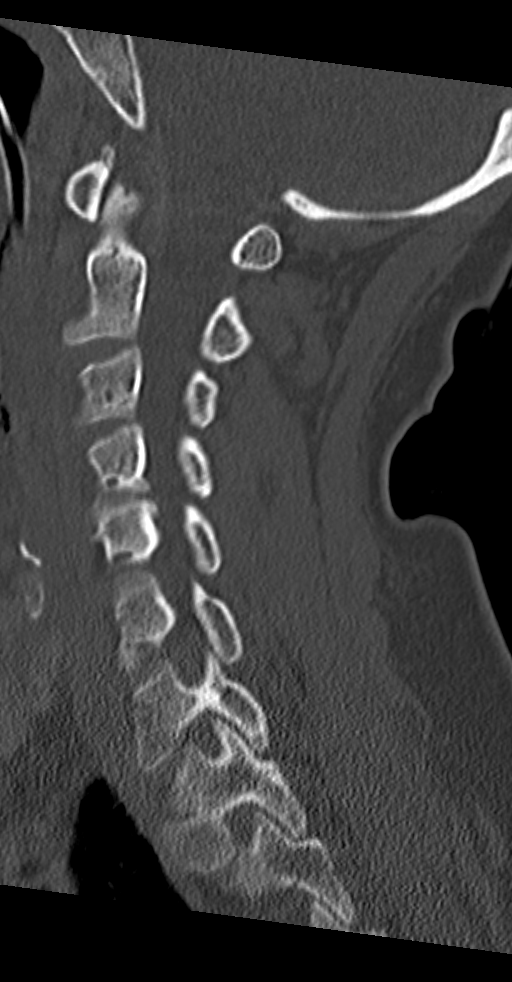
[im 24/47  soft-tissue]
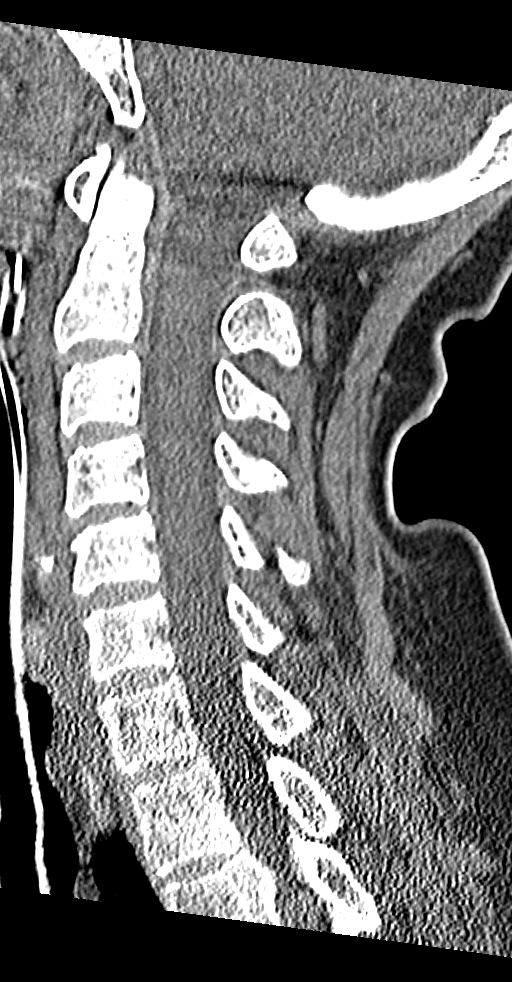
[im 24/47  bone]
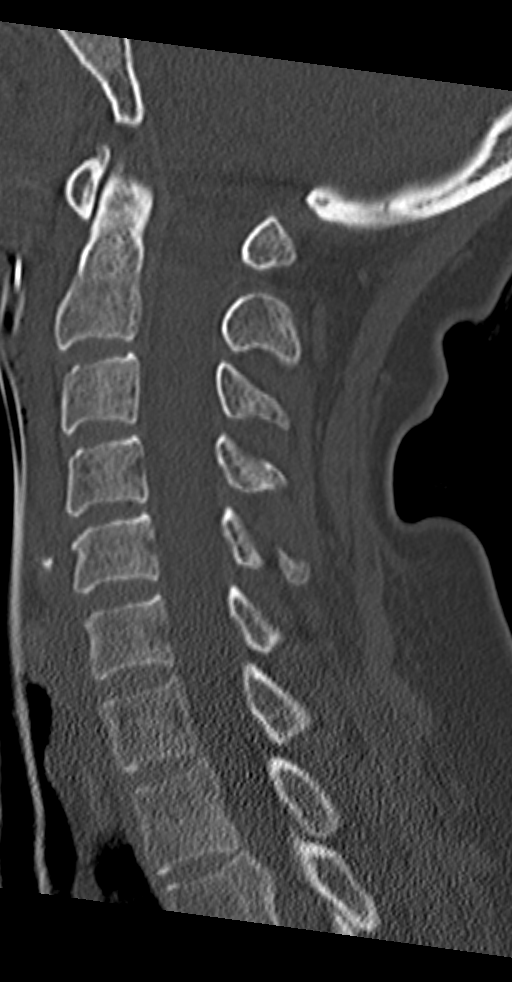
[im 27/47  bone]
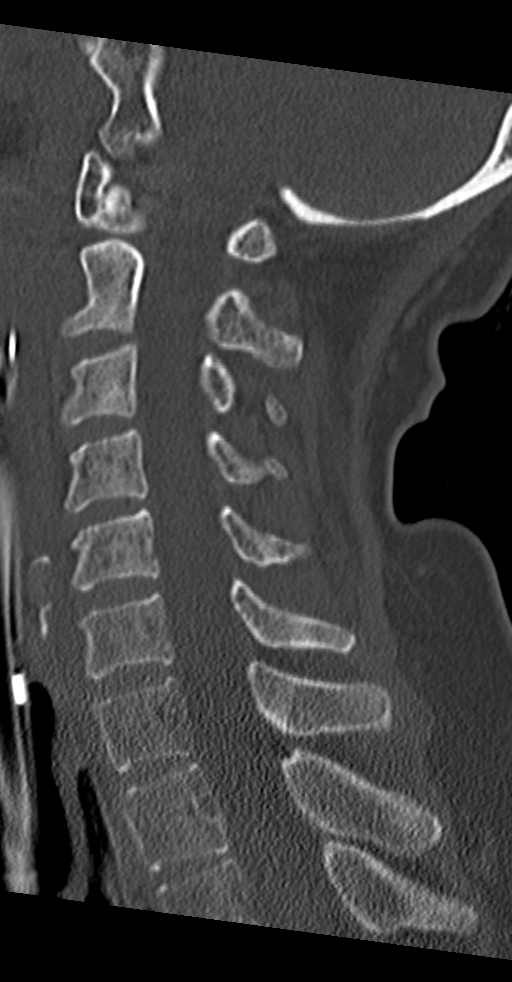
[im 31/47  bone]
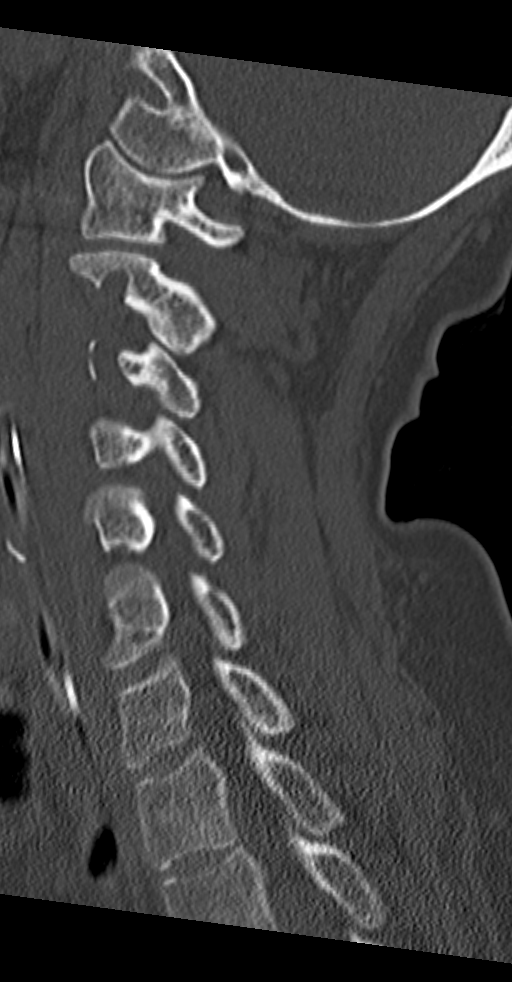

[Series 8: coronal bone 2.0 · coronal · 0.20mm/px · 3 of 49 slices shown]
[im 10/49  bone]
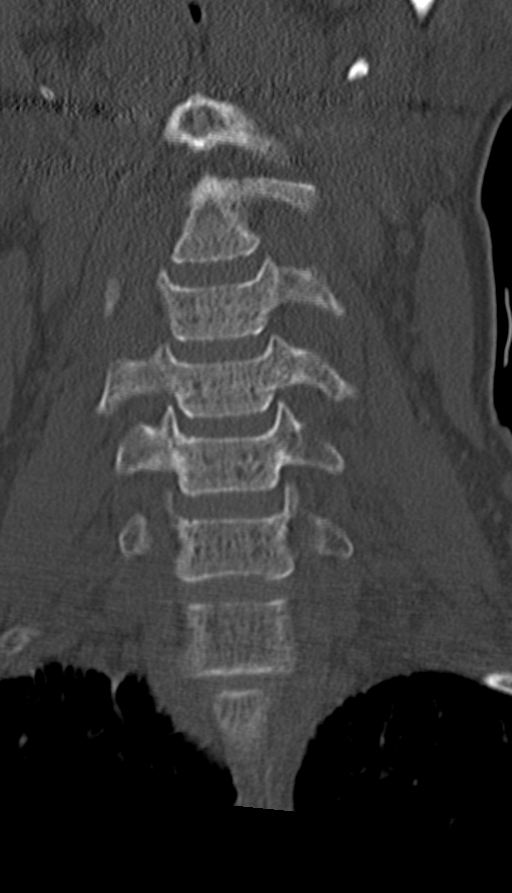
[im 20/49  bone]
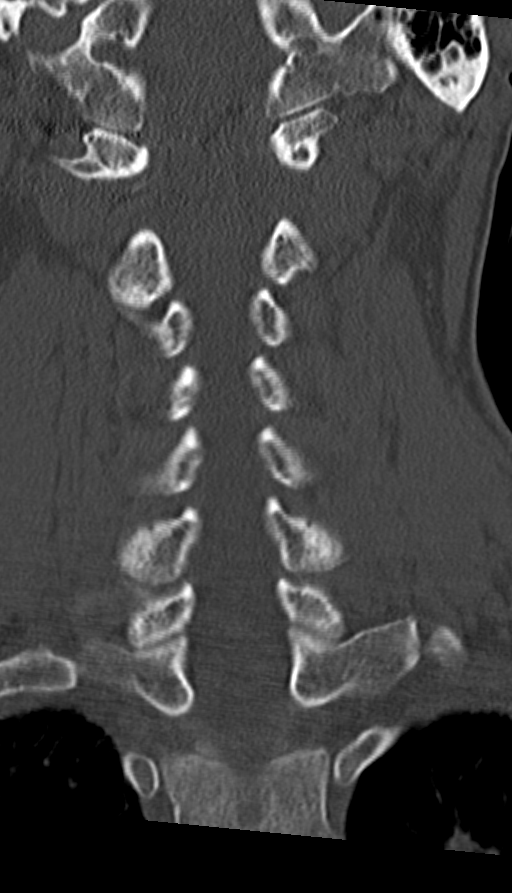
[im 29/49  bone]
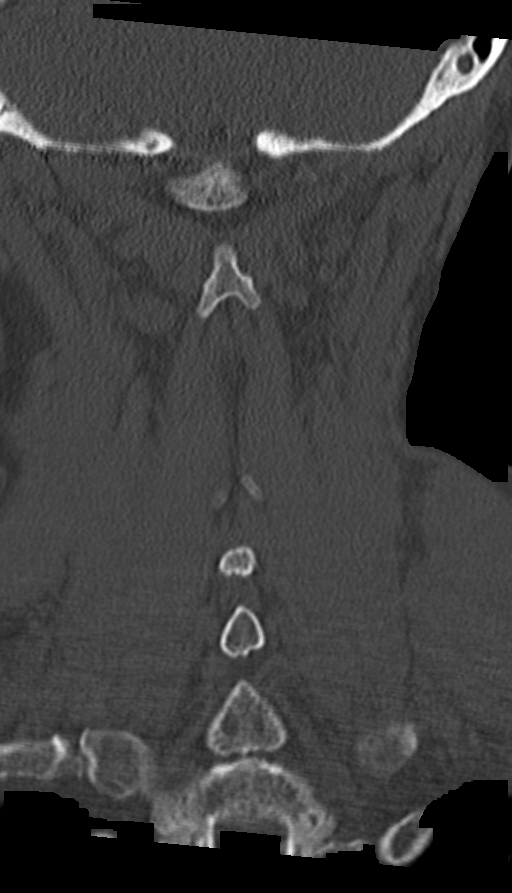

[10 of 33 positions shown; findings below may reference images not displayed]

FINDINGS: CT HEAD FINDINGS

Small ventricular system.

No significant midline shift.

Diminished gray-white differentiation diffusely throughout the
cerebral hemispheres with low-attenuation at the basal ganglia,
likely representing diffuse edema/anoxia.

No intracranial hemorrhage, mass lesion, extra-axial fluid
collection or focal acute infarct.

Sinuses and mastoid air cells clear.

No acute osseous findings.

CT CERVICAL SPINE FINDINGS

Endotracheal nasogastric tubes present.

No definite prevertebral soft tissue swelling.

Osseous mineralization normal.

Vertebral body and disc space heights maintained.

Visualized skullbase intact.

No cervical spine fracture or subluxation.

Spiculated density LEFT apex 12 x 10 mm, question scar versus
pulmonary nodule/tumor.
IMPRESSION: Probable diffuse cerebral edema/anoxia with poor gray-white
differentiation and low-attenuation of the basal ganglia.

No acute intracranial hemorrhage.

No acute cervical spine abnormalities.

Spiculated 12 x 10 mm LEFT upper lobe nodular density question
scarring though pulmonary neoplasm not excluded.

## 2017-06-01 IMAGING — CR DG ABD PORTABLE 1V
1 series · 1 of 1 positions shown · non-contrast
Comparison: Chest x-ray 10/07/2015.

CLINICAL DATA: Orogastric tube placement.

EXAM:
PORTABLE ABDOMEN - 1 VIEW

[AP]
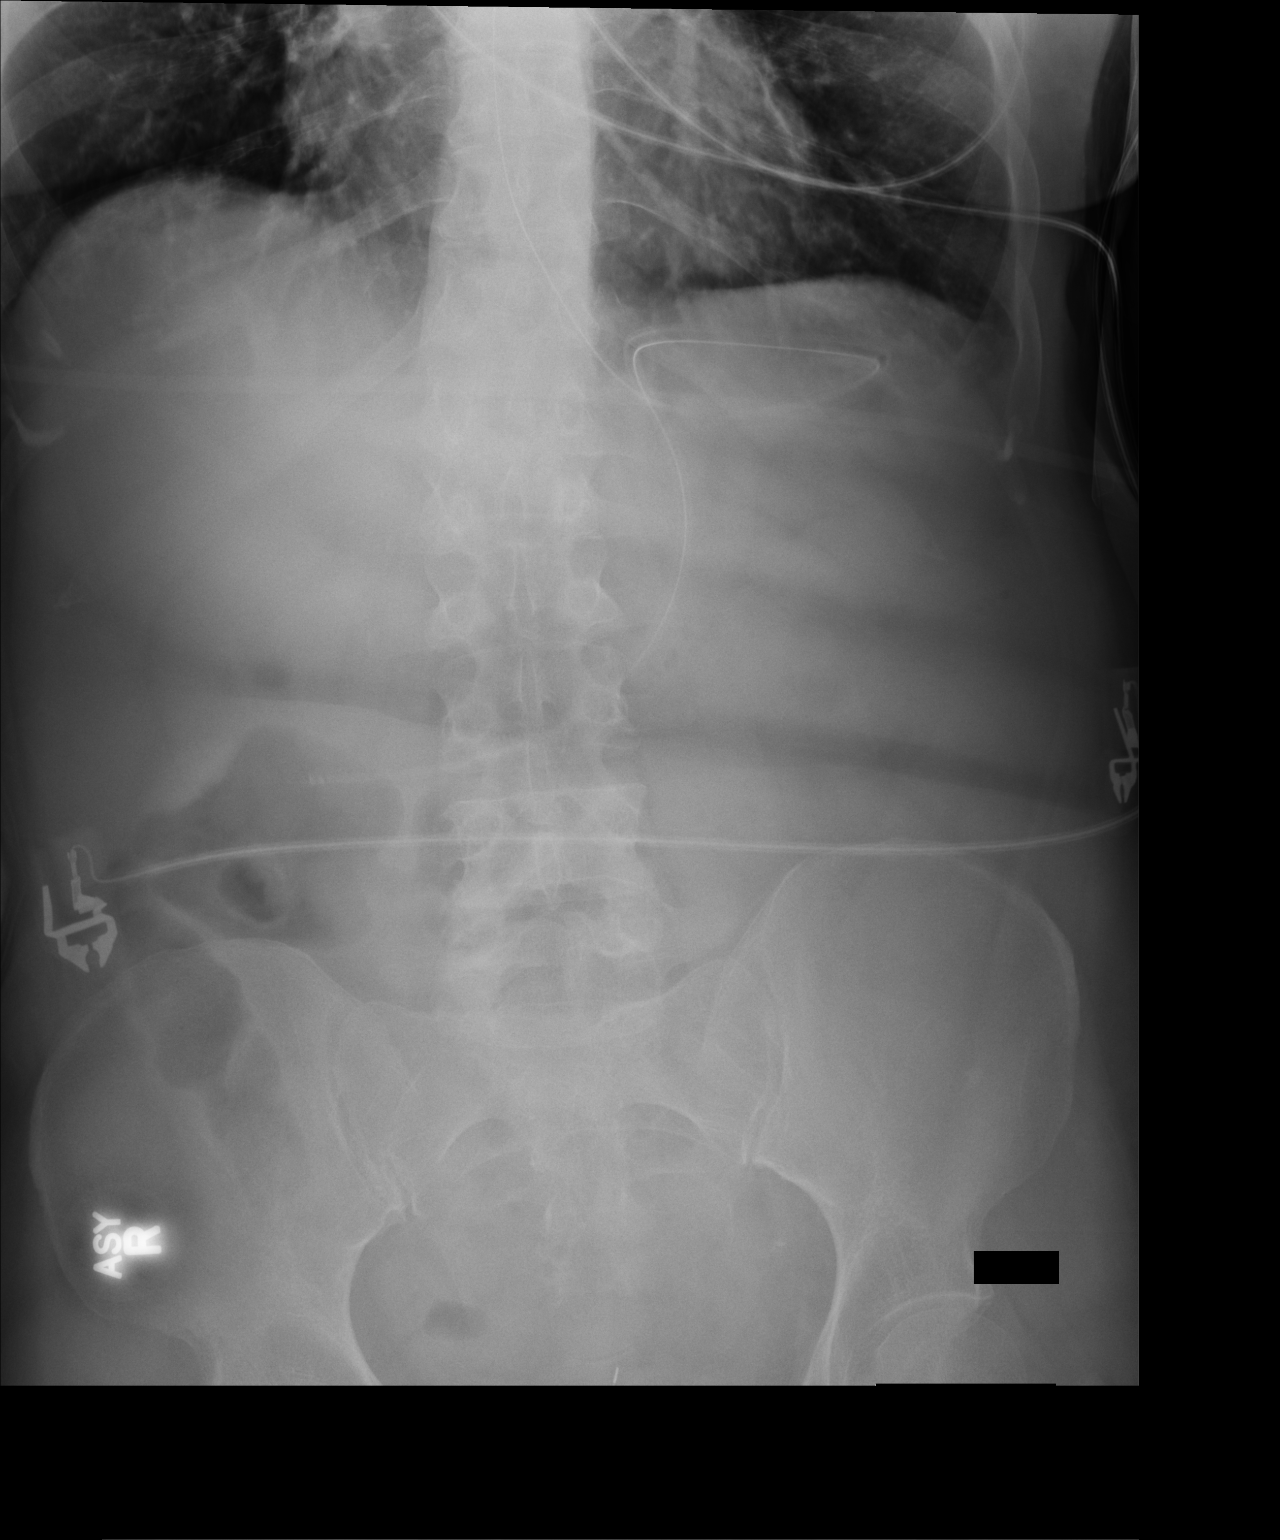

[1 of 1 positions shown; findings below may reference images not displayed]

FINDINGS: Orogastric tube noted with its tip projected over distal stomach.
Soft tissue structures are unremarkable. No bowel or gastric
distention. No free air. No acute bony abnormality.
IMPRESSION: Orogastric tube noted with its tip projected over the distal
stomach. No bowel or gastric distention.
